# Patient Record
Sex: Female | Born: 1990 | Race: White | Hispanic: No | Marital: Married | Smoking: Current every day smoker
Health system: Southern US, Community
[De-identification: ages and names within clinical notes are randomized; demographics above are authoritative.]

## PROBLEM LIST (undated history)

## (undated) DIAGNOSIS — F329 Major depressive disorder, single episode, unspecified: Secondary | ICD-10-CM

## (undated) DIAGNOSIS — F4310 Post-traumatic stress disorder, unspecified: Secondary | ICD-10-CM

## (undated) DIAGNOSIS — J42 Unspecified chronic bronchitis: Secondary | ICD-10-CM

## (undated) DIAGNOSIS — N390 Urinary tract infection, site not specified: Secondary | ICD-10-CM

## (undated) DIAGNOSIS — F419 Anxiety disorder, unspecified: Secondary | ICD-10-CM

## (undated) DIAGNOSIS — R519 Headache, unspecified: Secondary | ICD-10-CM

## (undated) DIAGNOSIS — G43909 Migraine, unspecified, not intractable, without status migrainosus: Secondary | ICD-10-CM

## (undated) DIAGNOSIS — F3181 Bipolar II disorder: Secondary | ICD-10-CM

## (undated) DIAGNOSIS — M797 Fibromyalgia: Secondary | ICD-10-CM

## (undated) DIAGNOSIS — F32A Depression, unspecified: Secondary | ICD-10-CM

## (undated) DIAGNOSIS — B019 Varicella without complication: Secondary | ICD-10-CM

## (undated) DIAGNOSIS — I959 Hypotension, unspecified: Secondary | ICD-10-CM

## (undated) DIAGNOSIS — G4700 Insomnia, unspecified: Secondary | ICD-10-CM

## (undated) DIAGNOSIS — R32 Unspecified urinary incontinence: Secondary | ICD-10-CM

## (undated) DIAGNOSIS — J45909 Unspecified asthma, uncomplicated: Secondary | ICD-10-CM

## (undated) DIAGNOSIS — M1990 Unspecified osteoarthritis, unspecified site: Secondary | ICD-10-CM

## (undated) DIAGNOSIS — R51 Headache: Secondary | ICD-10-CM

## (undated) DIAGNOSIS — K219 Gastro-esophageal reflux disease without esophagitis: Secondary | ICD-10-CM

## (undated) DIAGNOSIS — R55 Syncope and collapse: Secondary | ICD-10-CM

## (undated) DIAGNOSIS — F509 Eating disorder, unspecified: Secondary | ICD-10-CM

## (undated) HISTORY — DX: Eating disorder, unspecified: F50.9

## (undated) HISTORY — DX: Syncope and collapse: R55

## (undated) HISTORY — DX: Headache, unspecified: R51.9

## (undated) HISTORY — DX: Urinary tract infection, site not specified: N39.0

## (undated) HISTORY — PX: TUBAL LIGATION: SHX77

## (undated) HISTORY — PX: SEPTOPLASTY: SUR1290

## (undated) HISTORY — DX: Unspecified osteoarthritis, unspecified site: M19.90

## (undated) HISTORY — DX: Unspecified urinary incontinence: R32

## (undated) HISTORY — DX: Unspecified chronic bronchitis: J42

## (undated) HISTORY — DX: Major depressive disorder, single episode, unspecified: F32.9

## (undated) HISTORY — DX: Gastro-esophageal reflux disease without esophagitis: K21.9

## (undated) HISTORY — DX: Varicella without complication: B01.9

## (undated) HISTORY — DX: Depression, unspecified: F32.A

## (undated) HISTORY — DX: Migraine, unspecified, not intractable, without status migrainosus: G43.909

## (undated) HISTORY — DX: Headache: R51

---

## 2018-08-31 ENCOUNTER — Other Ambulatory Visit: Payer: Self-pay

## 2018-08-31 ENCOUNTER — Emergency Department (HOSPITAL_COMMUNITY)
Admission: EM | Admit: 2018-08-31 | Discharge: 2018-08-31 | Disposition: A | Discharge status: Home / Self Care | Payer: 59 | Attending: Emergency Medicine | Admitting: Emergency Medicine

## 2018-08-31 ENCOUNTER — Encounter (HOSPITAL_COMMUNITY): Payer: Self-pay | Admitting: Obstetrics and Gynecology

## 2018-08-31 ENCOUNTER — Emergency Department (HOSPITAL_COMMUNITY): Payer: 59

## 2018-08-31 DIAGNOSIS — Z793 Long term (current) use of hormonal contraceptives: Secondary | ICD-10-CM | POA: Insufficient documentation

## 2018-08-31 DIAGNOSIS — R0789 Other chest pain: Secondary | ICD-10-CM | POA: Insufficient documentation

## 2018-08-31 DIAGNOSIS — F319 Bipolar disorder, unspecified: Secondary | ICD-10-CM | POA: Diagnosis not present

## 2018-08-31 DIAGNOSIS — F17210 Nicotine dependence, cigarettes, uncomplicated: Secondary | ICD-10-CM | POA: Diagnosis not present

## 2018-08-31 DIAGNOSIS — R0602 Shortness of breath: Secondary | ICD-10-CM | POA: Diagnosis not present

## 2018-08-31 DIAGNOSIS — Z5320 Procedure and treatment not carried out because of patient's decision for unspecified reasons: Secondary | ICD-10-CM | POA: Insufficient documentation

## 2018-08-31 DIAGNOSIS — R002 Palpitations: Secondary | ICD-10-CM | POA: Diagnosis not present

## 2018-08-31 HISTORY — DX: Anxiety disorder, unspecified: F41.9

## 2018-08-31 HISTORY — DX: Unspecified asthma, uncomplicated: J45.909

## 2018-08-31 HISTORY — DX: Migraine, unspecified, not intractable, without status migrainosus: G43.909

## 2018-08-31 HISTORY — DX: Insomnia, unspecified: G47.00

## 2018-08-31 HISTORY — DX: Hypotension, unspecified: I95.9

## 2018-08-31 HISTORY — DX: Post-traumatic stress disorder, unspecified: F43.10

## 2018-08-31 HISTORY — DX: Bipolar II disorder: F31.81

## 2018-08-31 LAB — BASIC METABOLIC PANEL
Anion gap: 7 (ref 5–15)
BUN: 12 mg/dL (ref 6–20)
CHLORIDE: 110 mmol/L (ref 98–111)
CO2: 24 mmol/L (ref 22–32)
Calcium: 9.3 mg/dL (ref 8.9–10.3)
Creatinine, Ser: 0.86 mg/dL (ref 0.44–1.00)
GFR calc Af Amer: >60 mL/min (ref >60)
GFR calc non Af Amer: >60 mL/min (ref >60)
GLUCOSE: 96 mg/dL (ref 70–99)
POTASSIUM: 4.3 mmol/L (ref 3.5–5.1)
SODIUM: 141 mmol/L (ref 135–145)

## 2018-08-31 LAB — CBC
HEMATOCRIT: 41.2 % (ref 36.0–46.0)
HEMOGLOBIN: 13.3 g/dL (ref 12.0–15.0)
MCH: 30 pg (ref 26.0–34.0)
MCHC: 32.3 g/dL (ref 30.0–36.0)
MCV: 93 fL (ref 80.0–100.0)
Platelets: 247 10*3/uL (ref 150–400)
RBC: 4.43 MIL/uL (ref 3.87–5.11)
RDW: 12.9 % (ref 11.5–15.5)
WBC: 6.2 10*3/uL (ref 4.0–10.5)
nRBC: 0 % (ref 0.0–0.2)

## 2018-08-31 LAB — I-STAT BETA HCG BLOOD, ED (NOT ORDERABLE): I-stat hCG, quantitative: <5 m[IU]/mL (ref <5)

## 2018-08-31 LAB — POCT I-STAT TROPONIN I: Troponin i, poc: 0 ng/mL (ref 0.00–0.08)

## 2018-08-31 LAB — TSH: TSH: 1.18 u[IU]/mL (ref 0.350–4.500)

## 2018-08-31 NOTE — ED Triage Notes (Signed)
Pt reports she has been having neck pain to her left arm, palpitations that wake her from the dead of sleep. Pt reports her left arm was completely numb when it woke her up this morning. Pt reports SOB as well.

## 2018-08-31 NOTE — ED Provider Notes (Signed)
[facility] Provider Note   CSN: [id number] Arrival date & time: 08/31/18  1245     History   Chief Complaint Chief Complaint  Patient presents with  . Palpitations  . Shortness of Breath    HPI Anne Hubbard is a 27 y.o. female.  The history is provided by the patient. No language interpreter was used.  Palpitations   Associated symptoms include shortness of breath.  Shortness of Breath      27 year old female with history of bipolar, PTSD, anxiety, asthma, presenting complaining of heart palpitation.  Patient states for the past 3 weeks she has had intermittent episodes of heart racing.  Heart sometimes racing as high as 140 bpm which was evidence on her smart watch..  Occasionally she was experienced pressure sensation to her midsternum that radiates across her shoulders and arm sometimes which may last for nearly a day, usually improves after taking Klonopin.  Today, she felt her heart racing, and taking Klonopin did not provide any significant relief.  She sometimes endorse lightheadedness dizziness.  She has chronic neck pain and chronic trouble swallowing which is not new.  She admits to drinking caffeine on a regular basis but not an increasing amount or any other stimulant medication.  Patient does vape.  She admits she recently [place] here from [place] which does increase her stress.  She denies any significant family history of premature cardiac death.  She endorsed her discomfort is mild at this time.  Patient does report that her mom has history of thyroid disease.   Past Medical History:  Diagnosis Date  . Anxiety   . Asthma   . Bipolar 2 disorder (HCC)   . Hypotension   . Insomnia   . Migraine   . PTSD (post-traumatic stress disorder)     There are no active problems to display for this patient.    The histories are not reviewed yet. Please review them in the "History" navigator section and refresh this  SmartLink.   OB History    Gravida      Para      Term      Preterm      AB      Living  0     SAB      TAB      Ectopic      Multiple      Live Births               Home Medications    Prior to Admission medications   Medication Sig Start Date End Date Taking? Authorizing Provider  clonazePAM (KLONOPIN) 1 MG tablet Take 1 mg by mouth 2 (two) times daily as needed for anxiety.   Yes [provider]  ibuprofen (ADVIL,MOTRIN) 800 MG tablet Take 800 mg by mouth every 8 (eight) hours as needed for headache.   Yes [provider]  Levonorgest-Eth Est & Eth Est Georg Ruddle(FAYOSIM) 42-21-21-7 DAYS TABS Take 1 tablet by mouth daily.   Yes [provider]  ondansetron (ZOFRAN) 4 MG tablet Take 4 mg by mouth every 8 (eight) hours as needed for nausea or vomiting.   Yes [provider]  sulfamethoxazole-trimethoprim (BACTRIM DS,SEPTRA DS) 800-160 MG tablet Take 1 tablet by mouth 2 (two) times daily.   Yes [provider]    Family History No family history on file.  Social History Social History   Tobacco Use  . Smoking status: Former Smoker    Last  attempt to quit: 08/31/2016    Years since quitting: 2.0  . Smokeless tobacco: Never Used  Substance Use Topics  . Alcohol use: Not Currently  . Drug use: Not Currently     Allergies   Benadryl [diphenhydramine]; Penicillins; Buspirone; Flax seed [bio-flax]; and Kiwi extract   Review of Systems Review of Systems  Respiratory: Positive for shortness of breath.   Cardiovascular: Positive for palpitations.  All other systems reviewed and are negative.    Physical Exam Updated Vital Signs BP 136/87 (BP Location: Left Arm)   Pulse 93   Temp 99 F (37.2 C) (Oral)   Resp (!) 22   Ht 5\' 3"  (1.6 m)   Wt 63.5 kg   LMP 01/29/2018 (Approximate)   SpO2 100%   BMI 24.80 kg/m   Physical Exam  Constitutional: She appears well-developed and well-nourished. No distress.  HENT:   Head: Atraumatic.  Eyes: Conjunctivae are normal.  Neck: Normal range of motion. Neck supple. No JVD present. No tracheal deviation present. No thyromegaly present.  Cardiovascular: Normal rate and regular rhythm.  Pulmonary/Chest: Breath sounds normal. She is in respiratory distress. She has no decreased breath sounds. She has no wheezes. She has no rhonchi. She has no rales.  Abdominal: Soft. There is no tenderness.  Musculoskeletal:       Right lower leg: She exhibits no edema.       Left lower leg: She exhibits no edema.  5 out of 5 strength to all 4 extremities.  Normal grip strength bilaterally.  Radial pulse 2+.  Sensation is intact throughout bilateral upper extremities.  Neurological: She is alert.  Skin: No rash noted.  Psychiatric: She has a normal mood and affect.  Nursing note and vitals reviewed.    ED Treatments / Results  Labs (all labs ordered are listed, but only abnormal results are displayed) Labs Reviewed  BASIC METABOLIC PANEL  CBC  TSH  I-STAT TROPONIN, ED  I-STAT BETA HCG BLOOD, ED ([facility], [facility], [facility] ONLY)  I-STAT BETA HCG BLOOD, ED (NOT ORDERABLE)  POCT I-STAT TROPONIN I  I-STAT TROPONIN, ED    EKG None   Date: 08/31/2018  Rate: 94  Rhythm: normal sinus rhythm  QRS Axis: normal  Intervals: normal  ST/T Wave abnormalities: normal  Conduction Disutrbances: none  Narrative Interpretation:   Old EKG Reviewed: No significant changes noted     Radiology Dg Chest 2 View  Result Date: 08/31/2018 CLINICAL DATA:  Acute onset LEFT-sided chest pain radiating into the LEFT arm associated with shortness of breath. EXAM: CHEST - 2 VIEW COMPARISON:  None. FINDINGS: Cardiomediastinal silhouette unremarkable. Lungs clear. Bronchovascular markings normal. Pulmonary vascularity normal. No pneumothorax. No pleural effusions. Visualized bony thorax intact. IMPRESSION: Normal examination. Electronically Signed   By: Hulan Saas M.D.   On: 08/31/2018 14:26     Procedures Procedures (including critical care time)  Medications Ordered in ED Medications - No data to display   Initial Impression / Assessment and Plan / ED Course  I have reviewed the triage vital signs and the nursing notes.  Pertinent labs & imaging results that were available during my care of the patient were reviewed by me and considered in my medical decision making (see chart for details).     BP 121/80 (BP Location: Left Arm)   Pulse 90   Temp 99 F (37.2 C) (Oral)   Resp 18   Ht 5\' 3"  (1.6 m)   Wt 63.5 kg   LMP 01/29/2018 (Approximate)  SpO2 97%   BMI 24.80 kg/m    Final Clinical Impressions(s) / ED Diagnoses   Final diagnoses:  Heart palpitations    ED Discharge Orders    None     3:24 PM Patient report having intermittent chest discomfort as well as heart palpitation for the past several weeks which has become progressively worse.  Most recent episodes awoke her from sleep and endorse tingling numbness to her left arm.  On exam, patient is resting comfortably in no acute discomfort.  No reproducible pain on exam.  She is neurovascular intact throughout all 4 extremities.  At this time I have low suspicion for dissection causing arm tingling sensation.  Her heart score is 2, low risk of mace.  She is PERC negative, doubt PE.  Given complaints of intermittent heart palpitation, will check TSH.  5:32 PM Normal TSH.  Pt request to be discharge at this time.  2nd set of troponin have not been performed yet.  Pt provided with resources, return precaution given.     Fayrene Helper, PA-C 08/31/18 1733    Mancel Bale, MD 09/01/18 [phone]

## 2018-09-10 ENCOUNTER — Emergency Department (HOSPITAL_COMMUNITY)
Admission: EM | Admit: 2018-09-10 | Discharge: 2018-09-10 | Disposition: A | Discharge status: Home / Self Care | Payer: 59 | Attending: Emergency Medicine | Admitting: Emergency Medicine

## 2018-09-10 ENCOUNTER — Other Ambulatory Visit: Payer: Self-pay

## 2018-09-10 ENCOUNTER — Encounter (HOSPITAL_COMMUNITY): Payer: Self-pay | Admitting: *Deleted

## 2018-09-10 DIAGNOSIS — M542 Cervicalgia: Secondary | ICD-10-CM | POA: Insufficient documentation

## 2018-09-10 DIAGNOSIS — Z79899 Other long term (current) drug therapy: Secondary | ICD-10-CM | POA: Diagnosis not present

## 2018-09-10 DIAGNOSIS — Z87891 Personal history of nicotine dependence: Secondary | ICD-10-CM | POA: Insufficient documentation

## 2018-09-10 DIAGNOSIS — H9202 Otalgia, left ear: Secondary | ICD-10-CM | POA: Insufficient documentation

## 2018-09-10 DIAGNOSIS — J45909 Unspecified asthma, uncomplicated: Secondary | ICD-10-CM | POA: Insufficient documentation

## 2018-09-10 HISTORY — DX: Fibromyalgia: M79.7

## 2018-09-10 MED ORDER — DIAZEPAM 5 MG PO TABS: 5.0000 mg | ORAL_TABLET | Freq: Every evening | ORAL | 0 refills | Status: DC | PRN

## 2018-09-10 NOTE — ED Provider Notes (Signed)
[facility]-EMERGENCY DEPT Provider Note: Anne Dell, MD, FACEP  CSN: [id number] MRN: [id number] ARRIVAL: 09/10/18 at 0137 ROOM: WA09/WA09   CHIEF COMPLAINT  Neck Pain and loss of hearing   HISTORY OF PRESENT ILLNESS  09/10/18 3:20 AM Anne Hubbard is a 27 y.o. female with a history of fibromyalgia, cervical degenerative disc disease and recurrent otitis media.  She is here with the sudden onset of a severe, sharp pain in her right occiput that radiated to the right side of her head and neck.  It was severe enough to prevent her from turning her neck.  It has subsequently significantly improved and is currently mild.  It is different than previous episodes of cervical radiculopathy.  She also has a history of recurrent otitis media and is having a sense of decreased hearing and pressure in her left ear.    Past Medical History:  Diagnosis Date  . Anxiety   . Asthma   . Bipolar 2 disorder (HCC)   . Fibromyalgia   . Hypotension   . Insomnia   . Migraine   . PTSD (post-traumatic stress disorder)     Past Surgical History:  Procedure Laterality Date  . SEPTOPLASTY    . TUBAL LIGATION      No family history on file.  Social History   Tobacco Use  . Smoking status: Former Smoker    Last attempt to quit: 08/31/2016    Years since quitting: 2.0  . Smokeless tobacco: Never Used  Substance Use Topics  . Alcohol use: Not Currently  . Drug use: Not Currently    Prior to Admission medications   Medication Sig Start Date End Date Taking? Authorizing Provider  clonazePAM (KLONOPIN) 1 MG tablet Take 1 mg by mouth 2 (two) times daily as needed for anxiety.    [provider]  ibuprofen (ADVIL,MOTRIN) 800 MG tablet Take 800 mg by mouth every 8 (eight) hours as needed for headache.    [provider]  Levonorgest-Eth Est & Eth Est Georg Ruddle) 42-21-21-7 DAYS TABS Take 1 tablet by mouth daily.    [provider]  ondansetron (ZOFRAN) 4 MG tablet Take 4 mg by  mouth every 8 (eight) hours as needed for nausea or vomiting.    [provider]  sulfamethoxazole-trimethoprim (BACTRIM DS,SEPTRA DS) 800-160 MG tablet Take 1 tablet by mouth 2 (two) times daily.    [provider]    Allergies Benadryl [diphenhydramine]; Penicillins; Buspirone; Flax seed [bio-flax]; and Kiwi extract   REVIEW OF SYSTEMS  Negative except as noted here or in the History of Present Illness.   PHYSICAL EXAMINATION  Initial Vital Signs Blood pressure 105/77, pulse 78, temperature 98.5 F (36.9 C), temperature source Oral, resp. rate 16, height 5\' 3"  (1.6 m), weight 63.5 kg, SpO2 100 %.  Examination General: Well-developed, well-nourished female in no acute distress; appearance consistent with age of record HENT: normocephalic; atraumatic; right TM normal; left TM slightly bulging but without erythema; mild right occipital tenderness Eyes: pupils equal, round and reactive to light; extraocular muscles intact Neck: supple Heart: regular rate and rhythm Lungs: clear to auscultation bilaterally Abdomen: soft; nondistended; nontender; bowel sounds present Extremities: No deformity; full range of motion Neurologic: Awake, alert and oriented; motor function intact in all extremities and symmetric; no facial droop Skin: Warm and dry Psychiatric: Normal mood and affect   RESULTS  Summary of this visit's results, reviewed by myself:   EKG Interpretation  Date/Time:    Ventricular Rate:  PR Interval:    QRS Duration:   QT Interval:    QTC Calculation:   R Axis:     Text Interpretation:        Laboratory Studies: No results found for this or any previous visit (from the past 24 hour(s)). Imaging Studies: No results found.  ED COURSE and MDM  Nursing notes and initial vitals signs, including pulse oximetry, reviewed.  Vitals:   09/10/18 0226 09/10/18 0229  BP: 105/77   Pulse: 78   Resp: 16   Temp: 98.5 F (36.9 C)   TempSrc: Oral     SpO2: 100%   Weight:  63.5 kg  Height:  5\' 3"  (1.6 m)   The patient declined in a simple nerve block.  She is requesting referral to ENT as her previous ENT had recommended tympanostomy tubes but she did not follow-up.  She is requesting something to help her sleep and we will try a short course of Valium.  PROCEDURES    ED DIAGNOSES     ICD-10-CM   1. Neck pain M54.2   2. Ear pain, left H92.02        Anne Hubbard, Anne RuizJohn, MD 09/10/18 [phone]

## 2018-09-10 NOTE — ED Triage Notes (Signed)
Pt stated "I started having neck pain about 20 minutes ago, losing my hearing and can't turn my head left."

## 2018-09-17 ENCOUNTER — Ambulatory Visit (INDEPENDENT_AMBULATORY_CARE_PROVIDER_SITE_OTHER): Payer: 59 | Admitting: Family Medicine

## 2018-09-17 ENCOUNTER — Encounter: Payer: Self-pay | Admitting: Family Medicine

## 2018-09-17 VITALS — BP 110/60 | HR 96 | Ht 63.0 in | Wt 138.0 lb

## 2018-09-17 DIAGNOSIS — F3130 Bipolar disorder, current episode depressed, mild or moderate severity, unspecified: Secondary | ICD-10-CM | POA: Diagnosis not present

## 2018-09-17 DIAGNOSIS — Z0000 Encounter for general adult medical examination without abnormal findings: Secondary | ICD-10-CM | POA: Diagnosis not present

## 2018-09-17 DIAGNOSIS — G43909 Migraine, unspecified, not intractable, without status migrainosus: Secondary | ICD-10-CM | POA: Diagnosis not present

## 2018-09-17 DIAGNOSIS — F4310 Post-traumatic stress disorder, unspecified: Secondary | ICD-10-CM

## 2018-09-17 DIAGNOSIS — H6982 Other specified disorders of Eustachian tube, left ear: Secondary | ICD-10-CM

## 2018-09-17 DIAGNOSIS — R002 Palpitations: Secondary | ICD-10-CM

## 2018-09-17 DIAGNOSIS — M2550 Pain in unspecified joint: Secondary | ICD-10-CM | POA: Diagnosis not present

## 2018-09-17 DIAGNOSIS — H6992 Unspecified Eustachian tube disorder, left ear: Secondary | ICD-10-CM

## 2018-09-17 LAB — URINALYSIS, ROUTINE W REFLEX MICROSCOPIC
Nitrite: NEGATIVE
Specific Gravity, Urine: 1.025 (ref 1.000–1.030)
UROBILINOGEN UA: 0.2 (ref 0.0–1.0)
Urine Glucose: NEGATIVE
pH: 6.5 (ref 5.0–8.0)

## 2018-09-17 LAB — COMPREHENSIVE METABOLIC PANEL
ALK PHOS: 51 U/L (ref 39–117)
ALT: 10 U/L (ref 0–35)
AST: 16 U/L (ref 0–37)
Albumin: 4.4 g/dL (ref 3.5–5.2)
BILIRUBIN TOTAL: 1.7 mg/dL — AB (ref 0.2–1.2)
BUN: 10 mg/dL (ref 6–23)
CALCIUM: 9.6 mg/dL (ref 8.4–10.5)
CO2: 27 meq/L (ref 19–32)
CREATININE: 0.91 mg/dL (ref 0.40–1.20)
Chloride: 107 mEq/L (ref 96–112)
GFR: 78.65 mL/min (ref >60.00)
GLUCOSE: 97 mg/dL (ref 70–99)
Potassium: 4.1 mEq/L (ref 3.5–5.1)
Sodium: 139 mEq/L (ref 135–145)
TOTAL PROTEIN: 7.6 g/dL (ref 6.0–8.3)

## 2018-09-17 LAB — LIPID PANEL
CHOL/HDL RATIO: 3
Cholesterol: 111 mg/dL (ref 0–200)
HDL: 37.8 mg/dL — ABNORMAL LOW (ref >39.00)
LDL Cholesterol: 59 mg/dL (ref 0–99)
NonHDL: 72.88
Triglycerides: 67 mg/dL (ref 0.0–149.0)
VLDL: 13.4 mg/dL (ref 0.0–40.0)
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
## 2018-09-18 LAB — HIV ANTIBODY (ROUTINE TESTING W REFLEX): HIV 1&2 Ab, 4th Generation: NONREACTIVE

## 2018-09-24 ENCOUNTER — Ambulatory Visit (INDEPENDENT_AMBULATORY_CARE_PROVIDER_SITE_OTHER): Payer: 59 | Admitting: Cardiology

## 2018-09-24 ENCOUNTER — Encounter: Payer: Self-pay | Admitting: Cardiology

## 2018-09-24 VITALS — BP 114/78 | HR 99 | Ht 63.0 in | Wt 138.1 lb

## 2018-09-24 DIAGNOSIS — I951 Orthostatic hypotension: Secondary | ICD-10-CM

## 2018-09-24 DIAGNOSIS — R002 Palpitations: Secondary | ICD-10-CM | POA: Diagnosis not present

## 2018-09-24 DIAGNOSIS — M249 Joint derangement, unspecified: Secondary | ICD-10-CM | POA: Diagnosis not present

## 2018-09-24 NOTE — Patient Instructions (Addendum)
Medication Instructions:  Your provider recommends that you continue on your current medications as directed. Please refer to the Current Medication list given to you today.    Labwork: None  Testing/Procedures: Your provider has requested that you have an echocardiogram. Echocardiography is a painless test that uses sound waves to create images of your heart. It provides your doctor with information about the size and shape of your heart and how well your heart's chambers and valves are working. This procedure takes approximately one hour. There are no restrictions for this procedure. You are scheduled for your ultrasound Friday, December 20 at 4:00PM. Please arrive by 3:30PM.   Follow-Up: You will be called to arrange an appointment with Dr. Jomarie LongsJoseph (the geneticist).   Your provider recommends that you schedule a follow-up appointment AS NEEDED with Dr. Anne FuSkains pending study results.

## 2018-09-24 NOTE — Progress Notes (Signed)
Cardiology Office Note:    Date:  09/24/2018   ID:  Anne DarterGina Hubbard, DOB 03/16/1991, MRN [id number]  PCP:  Mliss SaxKremer, William Alfred, MD  Cardiologist:  No primary care provider on file.  Electrophysiologist:  None   Referring MD: Mliss SaxKremer, William Alfred,*     History of Present Illness:    Anne DarterGina Hubbard is a 27 y.o. female here for the evaluation of palpitations at the request of Dr. Doreene BurkeKremer.  Was in the emergency department on 09/10/2018 as well as 08/31/2018.  In review of their notes, she has a history of fibromyalgia PTSD cervical degenerative disease and recurrent otitis media.  In late November she noted that she was having for the past 3 weeks intermittent racing heart.  Sometimes her heart rate was as high as 140 bpm and this was detected by her smart watch.  She experienced pressure to the mid sternum to the shoulders usually improving by Klonopin.  She went to the ER on 08/31/2018 because the Klonopin did not relieve her heart racing.  Chronic neck pain trouble swallowing she uses a vape.  EKG from 08/31/2018 shows sinus rhythm with mild sinus arrhythmia, normal variant heart rate in the 80s to 90s. Every other day. Last Thurs 2 episodes bad feel and and hear 120-130, beating hart. CP with this. Maybe SOB at times when laying. Sitting.  Pectus deformity, hypermobile joints.  Dislocations.  Has seen orthopedics.  Cut down on caffiene. Feels worse. At rest 95-100. On standing, HR will increase. BP decreases. 117 when stnding.  Worries about EDS.   Exercising at one point, her heart rate she says went up to 220 transiently.  Quit cigs 2 years ago. Vapes.   Past Medical History:  Diagnosis Date  . Anxiety   . Arthritis   . Asthma   . Bipolar 2 disorder (HCC)   . Chicken pox   . Chronic bronchitis (HCC)   . Depression   . Eating disorder   . Fainting spell   . Fibromyalgia   . Frequent headaches   . GERD (gastroesophageal reflux disease)   . Hypotension   . Insomnia   .  Migraine   . Migraines   . PTSD (post-traumatic stress disorder)   . Urinary tract infection   . Urine incontinence     Past Surgical History:  Procedure Laterality Date  . SEPTOPLASTY    . TUBAL LIGATION      Current Medications: Current Meds  Medication Sig  . clonazePAM (KLONOPIN) 1 MG tablet Take 1 mg by mouth 2 (two) times daily as needed for anxiety.  . diazepam (VALIUM) 5 MG tablet Take 1 tablet (5 mg total) by mouth at bedtime as needed (insomnia).  Marland Kitchen. ibuprofen (ADVIL,MOTRIN) 800 MG tablet Take 800 mg by mouth every 8 (eight) hours as needed for headache.  Clelia Schaumann. Levonorgest-Eth Est & Eth Est (FAYOSIM) 42-21-21-7 DAYS TABS Take 1 tablet by mouth daily.  . ondansetron (ZOFRAN) 4 MG tablet Take 4 mg by mouth every 8 (eight) hours as needed for nausea or vomiting.     Allergies:   Amoxicillin; Benadryl [diphenhydramine]; Penicillins; Buspirone; Flax seed [bio-flax]; and Kiwi extract   Social History   Socioeconomic History  . Marital status: Married    Spouse name: Not on file  . Number of children: Not on file  . Years of education: Not on file  . Highest education level: Not on file  Occupational History  . Not on file  Social Needs  . Financial  resource strain: Not on file  . Food insecurity:    Worry: Not on file    Inability: Not on file  . Transportation needs:    Medical: Not on file    Non-medical: Not on file  Tobacco Use  . Smoking status: Current Every Day Smoker    Types: E-cigarettes    Last attempt to quit: 08/31/2016    Years since quitting: 2.0  . Smokeless tobacco: Never Used  Substance and Sexual Activity  . Alcohol use: Not Currently  . Drug use: Not Currently  . Sexual activity: Not Currently  Lifestyle  . Physical activity:    Days per week: Not on file    Minutes per session: Not on file  . Stress: Not on file  Relationships  . Social connections:    Talks on phone: Not on file    Gets together: Not on file    Attends religious  service: Not on file    Active member of club or organization: Not on file    Attends meetings of clubs or organizations: Not on file    Relationship status: Not on file  Other Topics Concern  . Not on file  Social History Narrative  . Not on file     Family History: The patient's family history includes Alcohol abuse in her sister; Arthritis in her father, maternal grandmother, and mother; Asthma in her mother and sister; COPD in her maternal grandfather; Cancer in her maternal grandfather and maternal grandmother; Depression in her mother, sister, and sister; Diabetes in her mother; Drug abuse in her mother; Hearing loss in her father, maternal grandfather, and sister; Hyperlipidemia in her father; Hypertension in her father; Intellectual disability in her sister; Learning disabilities in her mother and sister; Mental illness in her sister and sister; Miscarriages / Stillbirths in her mother; Stroke in her maternal grandmother and mother.  ROS:   Please see the history of present illness.     All other systems reviewed and are negative.  EKGs/Labs/Other Studies Reviewed:    The following studies were reviewed today: Prior office notes lab work EKG  EKG:  EKG is not ordered today.  Prior personally reviewed and interpreted shows sinus rhythm with sinus arrhythmia heart rate in the 80s to 90s  Recent Labs: 08/31/2018: Hemoglobin 13.3; Platelets 247; TSH 1.180 09/17/2018: ALT 10; BUN 10; Creatinine, Ser 0.91; Potassium 4.1; Sodium 139  Recent Lipid Panel    Component Value Date/Time   CHOL 111 09/17/2018 1347   TRIG 67.0 09/17/2018 1347   HDL 37.80 (L) 09/17/2018 1347   CHOLHDL 3 09/17/2018 1347   VLDL 13.4 09/17/2018 1347   LDLCALC 59 09/17/2018 1347    Physical Exam:    VS:  BP 114/78   Pulse 99   Ht 5\' 3"  (1.6 m)   Wt 138 lb 1.9 oz (62.7 kg)   SpO2 98%   BMI 24.47 kg/m     Wt Readings from Last 3 Encounters:  09/24/18 138 lb 1.9 oz (62.7 kg)  09/17/18 138 lb (62.6  kg)  09/10/18 140 lb (63.5 kg)     GEN:  Well nourished, well developed in no acute distress HEENT: Normal NECK: No JVD; No carotid bruits LYMPHATICS: No lymphadenopathy CARDIAC: RRR, no murmurs, rubs, gallops RESPIRATORY:  Clear to auscultation without rales, wheezing or rhonchi  ABDOMEN: Soft, non-tender, non-distended MUSCULOSKELETAL:  No edema; No deformity, hypermobility of thumb joint noted SKIN: Warm and dry, tattoos noted NEUROLOGIC:  Alert and oriented x  3 PSYCHIATRIC:  Normal affect   ASSESSMENT:    1. Palpitations   2. Orthostatic hypotension   3. Hypermobility of joint    PLAN:    In order of problems listed above:  Palpitations - Felt as though heart rate was in the 140 range.  Smart watch.  We will check an echocardiogram to ensure proper structure and function of her heart.  TSH was normal in November 2019.  Electrolytes were normal.  At this time, conservative management with adequate hydration, salt liberalization, compression stockings when able.  She does own compression hose.  Gradual increase in decrease point toward sinus tachycardia.  Told her that if she was incessant extreme tachycardia that that would be a different modality/etiology.  She was also concerned about tachycardia mediated cardiomyopathy.  This is this was also discussed, it usually takes several days to weeks of incessant tachycardia i.e. atrial flutter at 150 bpm to cause this abnormality.  Nonetheless, we are checking an echocardiogram.  Possible EDS - I will refer to our geneticist, Dr. Jomarie Longs for further evaluation.  Hopefully she will be able to shed some light on this condition for her.  Echocardiogram will also be ordered to make sure that she does not have any proximal aortic disease.  Orthostatic hypotension/near syncope -Conservative measures salt liberalization fluids careful when getting up.  Okay to exercise.   Medication Adjustments/Labs and Tests Ordered: Current medicines  are reviewed at length with the patient today.  Concerns regarding medicines are outlined above.  Orders Placed This Encounter  Procedures  . ECHOCARDIOGRAM COMPLETE   No orders of the defined types were placed in this encounter.   Patient Instructions  Medication Instructions:  Your provider recommends that you continue on your current medications as directed. Please refer to the Current Medication list given to you today.    Labwork: None  Testing/Procedures: Your provider has requested that you have an echocardiogram. Echocardiography is a painless test that uses sound waves to create images of your heart. It provides your doctor with information about the size and shape of your heart and how well your heart's chambers and valves are working. This procedure takes approximately one hour. There are no restrictions for this procedure. You are scheduled for your ultrasound Friday, December 20 at 4:00PM. Please arrive by 3:30PM.   Follow-Up: You will be called to arrange an appointment with Dr. Jomarie Longs (the geneticist).   Your provider recommends that you schedule a follow-up appointment AS NEEDED with Dr. Anne Fu pending study results.     Signed, Donato Schultz, MD  09/24/2018 1:14 PM    [facility]

## 2018-09-26 NOTE — Progress Notes (Signed)
Established Patient Office Visit  Subjective:  Patient ID: Anne DarterGina Fichera, female    DOB: 08/16/1991  Age: 27 y.o. MRN: [id number]  CC:  Chief Complaint  Patient presents with  . Medication Management  . Referral    geneticist    HPI Anne Hubbard presents for medication management.  Patient presents today with her roommate for follow-up of her fibromyalgia, bipolar disorder, anxiety muscle spasms and insomnia.  She was given 5 mg of Valium by the emergency room doctor and is requesting to increase the dosage to 10 mg twice daily.  She is awaiting referrals to neurology, psychiatry and psychology.  She tells me that she has a pinched nerve in her neck as well.  Past Medical History:  Diagnosis Date  . Anxiety   . Arthritis   . Asthma   . Bipolar 2 disorder (HCC)   . Chicken pox   . Chronic bronchitis (HCC)   . Depression   . Eating disorder   . Fainting spell   . Fibromyalgia   . Frequent headaches   . GERD (gastroesophageal reflux disease)   . Hypotension   . Insomnia   . Migraine   . Migraines   . PTSD (post-traumatic stress disorder)   . Urinary tract infection   . Urine incontinence     Past Surgical History:  Procedure Laterality Date  . SEPTOPLASTY    . TUBAL LIGATION      Family History  Problem Relation Age of Onset  . Arthritis Mother   . Asthma Mother   . Depression Mother   . Diabetes Mother   . Drug abuse Mother   . Miscarriages / [place] Mother   . Stroke Mother   . Learning disabilities Mother   . Arthritis Father   . Hearing loss Father   . Hyperlipidemia Father   . Hypertension Father   . Alcohol abuse Sister   . Asthma Sister   . Depression Sister   . Mental illness Sister   . Arthritis Maternal Grandmother   . Cancer Maternal Grandmother   . Stroke Maternal Grandmother   . Cancer Maternal Grandfather   . COPD Maternal Grandfather   . Hearing loss Maternal Grandfather   . Depression Sister   . Hearing loss Sister   .  Intellectual disability Sister   . Mental illness Sister   . Learning disabilities Sister     Social History   Socioeconomic History  . Marital status: Married    Spouse name: Not on file  . Number of children: Not on file  . Years of education: Not on file  . Highest education level: Not on file  Occupational History  . Not on file  Social Needs  . Financial resource strain: Not on file  . Food insecurity:    Worry: Not on file    Inability: Not on file  . Transportation needs:    Medical: Not on file    Non-medical: Not on file  Tobacco Use  . Smoking status: Current Every Day Smoker    Types: E-cigarettes    Last attempt to quit: 08/31/2016    Years since quitting: 2.0  . Smokeless tobacco: Never Used  Substance and Sexual Activity  . Alcohol use: Not Currently  . Drug use: Not Currently  . Sexual activity: Not Currently  Lifestyle  . Physical activity:    Days per week: Not on file    Minutes per session: Not on file  . Stress: Not on  file  Relationships  . Social connections:    Talks on phone: Not on file    Gets together: Not on file    Attends religious service: Not on file    Active member of club or organization: Not on file    Attends meetings of clubs or organizations: Not on file    Relationship status: Not on file  . Intimate partner violence:    Fear of current or ex partner: Not on file    Emotionally abused: Not on file    Physically abused: Not on file    Forced sexual activity: Not on file  Other Topics Concern  . Not on file  Social History Narrative  . Not on file    Outpatient Medications Prior to Visit  Medication Sig Dispense Refill  . ibuprofen (ADVIL,MOTRIN) 800 MG tablet Take 800 mg by mouth every 8 (eight) hours as needed for headache.    Anne Hubbard Est (FAYOSIM) 42-21-21-7 DAYS TABS Take 1 tablet by mouth daily.    . ondansetron (ZOFRAN) 4 MG tablet Take 4 mg by mouth every 8 (eight) hours as needed for nausea  or vomiting.    . clonazePAM (KLONOPIN) 1 MG tablet Take 1 mg by mouth 2 (two) times daily as needed for anxiety.    . diazepam (VALIUM) 5 MG tablet Take 1 tablet (5 mg total) by mouth at bedtime as needed (insomnia). 10 tablet 0   No facility-administered medications prior to visit.     Allergies  Allergen Reactions  . Amoxicillin Anaphylaxis  . Benadryl [Diphenhydramine] Anaphylaxis  . Penicillins Anaphylaxis and Hives    Has patient had a PCN reaction causing immediate rash, facial/tongue/throat swelling, SOB or lightheadedness with hypotension: Yes Has patient had a PCN reaction causing severe rash involving mucus membranes or skin necrosis: No Has patient had a PCN reaction that required hospitalization: No Has patient had a PCN reaction occurring within the last 10 years: No If all of the above answers are "NO", then may proceed with Cephalosporin use.   Marland Kitchen Buspirone     SOB  . Flax Seed [Bio-Flax] Other (See Comments)    Tongue swelling  . Kiwi Extract Other (See Comments)    Tongue swelling    ROS Review of Systems  Constitutional: Negative.   Respiratory: Negative.   Cardiovascular: Negative.   Gastrointestinal: Negative.   Musculoskeletal: Positive for back pain, neck pain and neck stiffness.  Neurological: Positive for headaches.  Psychiatric/Behavioral: Positive for dysphoric mood. The patient is nervous/anxious.       Objective:    Physical Exam  Constitutional: She is oriented to person, place, and time. She appears well-developed and well-nourished. No distress.  HENT:  Head: Normocephalic and atraumatic.  Right Ear: External ear normal.  Left Ear: External ear normal.  Eyes: Right eye exhibits no discharge. Left eye exhibits no discharge. No scleral icterus.  Pulmonary/Chest: Effort normal.  Neurological: She is alert and oriented to person, place, and time.  Skin: She is not diaphoretic.  Psychiatric: She has a normal mood and affect. Her behavior is  normal.    BP 120/70   Pulse 85   Ht 5\' 3"  (1.6 m)   Wt 138 lb (62.6 kg)   SpO2 98%   BMI 24.45 kg/m  Wt Readings from Last 3 Encounters:  09/27/18 138 lb (62.6 kg)  09/24/18 138 lb 1.9 oz (62.7 kg)  09/17/18 138 lb (62.6 kg)   BP Readings from Last  3 Encounters:  09/27/18 120/70  09/24/18 114/78  09/17/18 110/60   Guideline developer:  UpToDate (see UpToDate for funding source) Date Released: June 2014  Health Maintenance Due  Topic Date Due  . Janet BerlinETANUS/TDAP  06/09/2010  . PAP-Cervical Cytology Screening  06/09/2012  . PAP SMEAR-Modifier  06/09/2012  . INFLUENZA VACCINE  05/09/2018    There are no preventive care reminders to display for this patient.  Lab Results  Component Value Date   TSH 1.180 08/31/2018   Lab Results  Component Value Date   WBC 6.2 08/31/2018   HGB 13.3 08/31/2018   HCT 41.2 08/31/2018   MCV 93.0 08/31/2018   PLT 247 08/31/2018   Lab Results  Component Value Date   NA 139 09/17/2018   K 4.1 09/17/2018   CO2 27 09/17/2018   GLUCOSE 97 09/17/2018   BUN 10 09/17/2018   CREATININE 0.91 09/17/2018   BILITOT 1.7 (H) 09/17/2018   ALKPHOS 51 09/17/2018   AST 16 09/17/2018   ALT 10 09/17/2018   PROT 7.6 09/17/2018   ALBUMIN 4.4 09/17/2018   CALCIUM 9.6 09/17/2018   ANIONGAP 7 08/31/2018   GFR 78.65 09/17/2018   Lab Results  Component Value Date   CHOL 111 09/17/2018   Lab Results  Component Value Date   HDL 37.80 (L) 09/17/2018   Lab Results  Component Value Date   LDLCALC 59 09/17/2018   Lab Results  Component Value Date   TRIG 67.0 09/17/2018   Lab Results  Component Value Date   CHOLHDL 3 09/17/2018   No results found for: HGBA1C    Assessment & Plan:   Problem List Items Addressed This Visit      Musculoskeletal and Integument   Muscle spasms of neck   Relevant Medications   diazepam (VALIUM) 5 MG tablet     Other   Bipolar affective disorder, current episode depressed (HCC)   Relevant Medications    diazepam (VALIUM) 5 MG tablet   Fibromyalgia - Primary   Relevant Orders   Ambulatory referral to Rheumatology   Anxiety   Relevant Medications   diazepam (VALIUM) 5 MG tablet   Insomnia   Relevant Medications   diazepam (VALIUM) 5 MG tablet      Meds ordered this encounter  Medications  . diazepam (VALIUM) 5 MG tablet    Sig: Take 1 tablet (5 mg total) by mouth every 12 (twelve) hours as needed for anxiety.    Dispense:  60 tablet    Refill:  0    Follow-up: No follow-ups on file.   Offered patient Seroquel to help with her bipolar disorder as well as insomnia.  She declined this medication because she told me that she had any eating disorder and did not want to gain weight.  I told her that I was uncomfortable increasing her Valium to 10 mg twice daily.  I also am not comfortable having her continue Klonopin and Valium.  We did agree to continue her on the Valium 5 mg twice daily.  I have gone ahead and referred her onto rheumatology for treatment of her reported fibromyalgia as well.  This patient is clearly in need of subspecialist care.  I spent 25 minutes with this patient with greater than 50% of the time spent in counseling.

## 2018-09-27 ENCOUNTER — Other Ambulatory Visit: Payer: Self-pay

## 2018-09-27 ENCOUNTER — Ambulatory Visit (HOSPITAL_COMMUNITY): Payer: 59 | Attending: Internal Medicine

## 2018-09-27 ENCOUNTER — Encounter: Payer: Self-pay | Admitting: Family Medicine

## 2018-09-27 ENCOUNTER — Ambulatory Visit (INDEPENDENT_AMBULATORY_CARE_PROVIDER_SITE_OTHER): Payer: 59 | Admitting: Family Medicine

## 2018-09-27 VITALS — BP 120/70 | HR 85 | Ht 63.0 in | Wt 138.0 lb

## 2018-09-27 DIAGNOSIS — F419 Anxiety disorder, unspecified: Secondary | ICD-10-CM | POA: Diagnosis not present

## 2018-09-27 DIAGNOSIS — M62838 Other muscle spasm: Secondary | ICD-10-CM

## 2018-09-27 DIAGNOSIS — F3130 Bipolar disorder, current episode depressed, mild or moderate severity, unspecified: Secondary | ICD-10-CM

## 2018-09-27 DIAGNOSIS — M797 Fibromyalgia: Secondary | ICD-10-CM | POA: Diagnosis not present

## 2018-09-27 DIAGNOSIS — R002 Palpitations: Secondary | ICD-10-CM | POA: Diagnosis not present

## 2018-09-27 DIAGNOSIS — G4700 Insomnia, unspecified: Secondary | ICD-10-CM | POA: Insufficient documentation

## 2018-09-27 LAB — ECHOCARDIOGRAM COMPLETE
Height: 63 in
Weight: 2208 oz

## 2018-09-27 MED ORDER — DIAZEPAM 5 MG PO TABS: 5.0000 mg | ORAL_TABLET | Freq: Two times a day (BID) | ORAL | 0 refills | Status: DC | PRN

## 2018-09-30 ENCOUNTER — Ambulatory Visit (INDEPENDENT_AMBULATORY_CARE_PROVIDER_SITE_OTHER): Payer: 59 | Admitting: Otolaryngology

## 2018-10-17 ENCOUNTER — Encounter: Payer: Self-pay | Admitting: Genetic Counselor

## 2018-11-07 ENCOUNTER — Ambulatory Visit (INDEPENDENT_AMBULATORY_CARE_PROVIDER_SITE_OTHER): Payer: Self-pay | Admitting: Otolaryngology

## 2018-11-08 ENCOUNTER — Ambulatory Visit: Payer: Self-pay | Admitting: Psychology

## 2018-11-15 ENCOUNTER — Emergency Department (HOSPITAL_COMMUNITY)
Admission: EM | Admit: 2018-11-15 | Discharge: 2018-11-15 | Disposition: A | Discharge status: Home / Self Care | Payer: 59 | Attending: Emergency Medicine | Admitting: Emergency Medicine

## 2018-11-15 ENCOUNTER — Telehealth: Payer: Self-pay | Admitting: Cardiology

## 2018-11-15 ENCOUNTER — Encounter (HOSPITAL_COMMUNITY): Payer: Self-pay

## 2018-11-15 DIAGNOSIS — J45909 Unspecified asthma, uncomplicated: Secondary | ICD-10-CM | POA: Insufficient documentation

## 2018-11-15 DIAGNOSIS — R000 Tachycardia, unspecified: Secondary | ICD-10-CM | POA: Diagnosis not present

## 2018-11-15 DIAGNOSIS — Z79899 Other long term (current) drug therapy: Secondary | ICD-10-CM | POA: Diagnosis not present

## 2018-11-15 DIAGNOSIS — F17290 Nicotine dependence, other tobacco product, uncomplicated: Secondary | ICD-10-CM | POA: Insufficient documentation

## 2018-11-15 DIAGNOSIS — R002 Palpitations: Secondary | ICD-10-CM | POA: Diagnosis present

## 2018-11-15 LAB — CBC
HCT: 45.4 % (ref 36.0–46.0)
Hemoglobin: 14.9 g/dL (ref 12.0–15.0)
MCH: 30 pg (ref 26.0–34.0)
MCHC: 32.8 g/dL (ref 30.0–36.0)
MCV: 91.5 fL (ref 80.0–100.0)
Platelets: 380 10*3/uL (ref 150–400)
RBC: 4.96 MIL/uL (ref 3.87–5.11)
RDW: 11.9 % (ref 11.5–15.5)
WBC: 10.1 10*3/uL (ref 4.0–10.5)
nRBC: 0 % (ref 0.0–0.2)

## 2018-11-15 LAB — I-STAT BETA HCG BLOOD, ED (MC, WL, AP ONLY): I-stat hCG, quantitative: <5 m[IU]/mL (ref <5)

## 2018-11-15 LAB — BASIC METABOLIC PANEL
Anion gap: 10 (ref 5–15)
BUN: 8 mg/dL (ref 6–20)
CALCIUM: 9.4 mg/dL (ref 8.9–10.3)
CO2: 21 mmol/L — ABNORMAL LOW (ref 22–32)
CREATININE: 0.94 mg/dL (ref 0.44–1.00)
Chloride: 108 mmol/L (ref 98–111)
GFR calc Af Amer: >60 mL/min (ref >60)
Glucose, Bld: 127 mg/dL — ABNORMAL HIGH (ref 70–99)
Potassium: 3.3 mmol/L — ABNORMAL LOW (ref 3.5–5.1)
Sodium: 139 mmol/L (ref 135–145)

## 2018-11-15 LAB — I-STAT TROPONIN, ED: TROPONIN I, POC: 0 ng/mL (ref 0.00–0.08)

## 2018-11-15 MED ORDER — SODIUM CHLORIDE 0.9 % IV BOLUS
1000.0000 mL | Freq: Once | INTRAVENOUS | Status: AC
  Administered 2018-11-15: 1000 mL via INTRAVENOUS

## 2018-11-15 MED ORDER — METOPROLOL TARTRATE 25 MG PO TABS: 50.0000 mg | ORAL_TABLET | Freq: Two times a day (BID) | ORAL | 0 refills | Status: DC | PRN

## 2018-11-15 MED ORDER — SODIUM CHLORIDE 0.9% FLUSH: 3.0000 mL | Freq: Once | INTRAVENOUS | Status: DC

## 2018-11-15 NOTE — ED Notes (Signed)
Patient told this RN she has been experiencing palpitations since Nov 2019. Patient reports she has seen a cardiologist and had an echo and ekg done recently. Patient states she was concerned that her panic attack was causing the tachycardia today, so she took her Clonopin 1mg , which did not alleviate her symptoms. That is when she came to the ED.

## 2018-11-15 NOTE — ED Triage Notes (Addendum)
HR-180  C/o palpitations and headache that started 45 min ago.   Denies taking any medications or energy drinks that would increase HR.    Hx. Panic Attack.   Patient able to speak in complete sentences. Friend with her.

## 2018-11-15 NOTE — Telephone Encounter (Signed)
New Message   STAT if HR is under 50 or over 120 (normal HR is 60-100 beats per minute)  1) What is your heart rate? 137  2) Do you have a log of your heart rate readings (document readings)? 120, 135, 137  3) Do you have any other symptoms? Very lightheaded

## 2018-11-15 NOTE — ED Provider Notes (Signed)
[facility]-EMERGENCY DEPT Provider Note   CSN: [id number] Arrival date & time: 11/15/18  1636     History   Chief Complaint Chief Complaint  Patient presents with  . Palpitations    HPI Anne Hubbard is a 28 y.o. female.  HPI Patient presents with tachycardia/palpitations.  Began around 45 minutes prior to arrival.  Heart rate 180.  History of same.  History of pots.  Also history of anxiety.  Sees Dr. Anne Fu from cardiology.  There was talk of starting some medication.  Denies drug use.  Denies pregnancy.  Denies feeling very anxious this time.  Does feel somewhat lightheaded with these episodes coming on. Past Medical History:  Diagnosis Date  . Anxiety   . Arthritis   . Asthma   . Bipolar 2 disorder (HCC)   . Chicken pox   . Chronic bronchitis (HCC)   . Depression   . Eating disorder   . Fainting spell   . Fibromyalgia   . Frequent headaches   . GERD (gastroesophageal reflux disease)   . Hypotension   . Insomnia   . Migraine   . Migraines   . PTSD (post-traumatic stress disorder)   . Urinary tract infection   . Urine incontinence     Patient Active Problem List   Diagnosis Date Noted  . Fibromyalgia 09/27/2018  . Anxiety 09/27/2018  . Muscle spasms of neck 09/27/2018  . Insomnia 09/27/2018  . ETD (Eustachian tube dysfunction), left 09/17/2018  . Migraine without status migrainosus, not intractable 09/17/2018  . Bipolar affective disorder, current episode depressed (HCC) 09/17/2018  . 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 PTSD (post-traumatic stress disorder) 09/17/2018  . Healthcare maintenance 09/17/2018    Past Surgical History:  Procedure Laterality Date  . SEPTOPLASTY    . TUBAL LIGATION       OB History    Gravida      Para      Term      Preterm      AB      Living  0     SAB      TAB      Ectopic      Multiple      Live Births               Home Medications    Prior to Admission medications    Medication Sig Start Date End Date Taking? Authorizing Provider  aspirin-acetaminophen-caffeine (EXCEDRIN MIGRAINE) [phone] MG tablet Take 1 tablet by mouth every 6 (six) hours as needed for headache.   Yes [provider]  clonazePAM (KLONOPIN) 1 MG tablet Take 1 mg by mouth 2 (two) times daily as needed for anxiety.   Yes [provider]  cyclobenzaprine (FLEXERIL) 5 MG tablet Take 5 mg by mouth 3 (three) times daily as needed for muscle spasms.   Yes [provider]  diazepam (VALIUM) 5 MG tablet Take 1 tablet (5 mg total) by mouth every 12 (twelve) hours as needed for anxiety. 09/27/18  Yes Mliss Sax, MD  ibuprofen (ADVIL,MOTRIN) 200 MG tablet Take 200 mg by mouth every 6 (six) hours as needed for mild pain.   Yes [provider]  Levonorgest-Eth Est & Eth Est Georg Ruddle) 42-21-21-7 DAYS TABS Take 1 tablet by mouth daily.   Yes [provider]  meclizine (ANTIVERT) 12.5 MG tablet Take 12.5 mg by mouth 3 (three) times daily as needed for dizziness.   Yes [provider]  ondansetron (  ZOFRAN) 4 MG tablet Take 4 mg by mouth every 8 (eight) hours as needed for nausea or vomiting.   Yes [provider]  metoprolol tartrate (LOPRESSOR) 25 MG tablet Take 2 tablets (50 mg total) by mouth 2 (two) times daily as needed. 11/15/18   Benjiman CorePickering, Breeze Berringer, MD    Family History Family History  Problem Relation Age of Onset  . Arthritis Mother   . Asthma Mother   . Depression Mother   . Diabetes Mother   . Drug abuse Mother   . Miscarriages / [place] Mother   . Stroke Mother   . Learning disabilities Mother   . Arthritis Father   . Hearing loss Father   . Hyperlipidemia Father   . Hypertension Father   . Alcohol abuse Sister   . Asthma Sister   . Depression Sister   . Mental illness Sister   . Arthritis Maternal Grandmother   . Cancer Maternal Grandmother   . Stroke Maternal Grandmother   . Cancer Maternal  Grandfather   . COPD Maternal Grandfather   . Hearing loss Maternal Grandfather   . Depression Sister   . Hearing loss Sister   . Intellectual disability Sister   . Mental illness Sister   . Learning disabilities Sister     Social History Social History   Tobacco Use  . Smoking status: Current Every Day Smoker    Types: E-cigarettes    Last attempt to quit: 08/31/2016    Years since quitting: 2.2  . Smokeless tobacco: Never Used  Substance Use Topics  . Alcohol use: Not Currently  . Drug use: Not Currently     Allergies   Amoxicillin; Benadryl [diphenhydramine]; Penicillins; Buspirone; Flax seed [bio-flax]; and Kiwi extract   Review of Systems Review of Systems  Constitutional: Negative for appetite change.  HENT: Negative for congestion.   Respiratory: Negative for shortness of breath.   Cardiovascular: Positive for palpitations. Negative for chest pain.  Genitourinary: Negative for flank pain.  Musculoskeletal: Negative for back pain.  Skin: Negative for rash.  Neurological: Positive for light-headedness.  Hematological: Does not bruise/bleed easily.     Physical Exam Updated Vital Signs BP 111/75 (BP Location: Right Arm)   Pulse (!) 104   Temp 98.6 F (37 C) (Oral)   Resp (!) 24   Ht 5\' 3"  (1.6 m)   Wt 63.5 kg   SpO2 99%   BMI 24.80 kg/m   Physical Exam Vitals signs and nursing note reviewed.  HENT:     Mouth/Throat:     Mouth: Mucous membranes are moist.  Cardiovascular:     Rate and Rhythm: Regular rhythm. Tachycardia present.  Pulmonary:     Effort: Pulmonary effort is normal.  Abdominal:     Tenderness: There is no abdominal tenderness.  Musculoskeletal:     Right lower leg: No edema.     Left lower leg: No edema.  Skin:    General: Skin is warm.     Capillary Refill: Capillary refill takes less than 2 seconds.  Neurological:     Mental Status: She is alert and oriented to person, place, and time.  Psychiatric:        Mood and Affect:  Mood normal.      ED Treatments / Results  Labs (all labs ordered are listed, but only abnormal results are displayed) Labs Reviewed  BASIC METABOLIC PANEL - Abnormal; Notable for the following components:      Result Value   Potassium 3.3 (*)  CO2 21 (*)    Glucose, Bld 127 (*)    All other components within normal limits  CBC  I-STAT TROPONIN, ED  I-STAT BETA HCG BLOOD, ED ([facility], [facility], [facility] ONLY)    EKG None  Radiology No results found.  Procedures Procedures (including critical care time)  Medications Ordered in ED Medications  sodium chloride 0.9 % bolus 1,000 mL (0 mLs Intravenous Stopped 11/15/18 1810)     Initial Impression / Assessment and Plan / ED Course  I have reviewed the triage vital signs and the nursing notes.  Pertinent labs & imaging results that were available during my care of the patient were reviewed by me and considered in my medical decision making (see chart for details).     Patient with tachycardia.  Heart rate had been up to around 180 but appeared sinus versus SVT.  Resolved on its own.  Reviewed records.  Lab work reassuring.  Will give 25 mg metoprolol as needed.  Follow-up with Dr. Anne Fu.  Final Clinical Impressions(s) / ED Diagnoses   Final diagnoses:  Tachycardia    ED Discharge Orders         Ordered    metoprolol tartrate (LOPRESSOR) 25 MG tablet  2 times daily PRN     11/15/18 1841           Benjiman Core, MD 11/16/18 0003

## 2018-11-15 NOTE — ED Notes (Signed)
Discharge instructions reviewed with patient. Patient verbalizes understanding. VSS. Patient admitted to this RN during discharge that she has been struggling with anorexia for 14 years. Patient reports she has purposefully lost a lot of weight and her idea of a large meal is a single "piece of pizza" and a diet soda. Patient educated by this RN on the need for protein and vitamins in her diet to support her health and healing. Patient also educated on hydration and how caffeine and dehydration are not helping her tachycardia. Patient encouraged to follow up with her previously scheduled cardiology appointment on Tuesday for further evaluation of her arrhythmia. Patient verbalizes she will follow up. Patient verbalizes understanding of diet and hydration education and states "I know I need to make a change." Patient states she has been trying to find a psychiatrist or counselor. Patient provided with phone number on paperwork to find a specialty provider. Patient grateful and ambulated from ED without assistance.

## 2018-11-15 NOTE — Telephone Encounter (Signed)
Patient has been having elevated HR and palpitations since early December and stated it just keeps getting worse. Patient stated she was recently diagnosis with EDS and POTS. Asked patient about her BP. Patient stated she does not have a way to check her BP at home. Patient stated the last couple of days her HR has been 120-130's and it is not going down. Patient complaining of having lightheadedness and calmly hands. Encouraged patient to go to ED if her heart rate is not coming down and with her symptoms. Made patient an appointment with Dr. Anne Fu to be evaluated. Patient state she would go to ED to get checked out and she will see Dr. Anne Fu early next week.

## 2018-11-18 NOTE — Telephone Encounter (Signed)
Thanks for update. HR 186 noted on ECG.  Donato Schultz, MD

## 2018-11-18 NOTE — Telephone Encounter (Signed)
Pt was seen, evaluated and treated in ED.  Has appt for f/u 11/19/2018 with Dr Anne Fu.

## 2018-11-19 ENCOUNTER — Encounter: Payer: Self-pay | Admitting: Cardiology

## 2018-11-19 ENCOUNTER — Ambulatory Visit (INDEPENDENT_AMBULATORY_CARE_PROVIDER_SITE_OTHER): Payer: 59 | Admitting: Cardiology

## 2018-11-19 VITALS — BP 110/76 | HR 92 | Ht 63.0 in | Wt 138.6 lb

## 2018-11-19 DIAGNOSIS — R002 Palpitations: Secondary | ICD-10-CM

## 2018-11-19 DIAGNOSIS — R000 Tachycardia, unspecified: Secondary | ICD-10-CM | POA: Diagnosis not present

## 2018-11-19 DIAGNOSIS — I951 Orthostatic hypotension: Secondary | ICD-10-CM

## 2018-11-19 DIAGNOSIS — G901 Familial dysautonomia [Riley-Day]: Secondary | ICD-10-CM

## 2018-11-19 DIAGNOSIS — M249 Joint derangement, unspecified: Secondary | ICD-10-CM

## 2018-11-19 MED ORDER — METOPROLOL TARTRATE 25 MG PO TABS: 25.0000 mg | ORAL_TABLET | Freq: Four times a day (QID) | ORAL | 6 refills | Status: AC | PRN

## 2018-11-19 NOTE — Patient Instructions (Signed)
Medication Instructions:  You may take Metoprolol tartrate 25 mg once every 6 hours as needed. Continue all other medications as listed.  If you need a refill on your cardiac medications before your next appointment, please call your pharmacy.   Please stay well hydrated with water, Gatorade etc and add salt as needed. This will help keep your blood pressure and heart rate stable.  Follow-Up: At [facility], you and your health needs are our priority.  As part of our continuing mission to provide you with exceptional heart care, we have created designated Provider Care Teams.  These Care Teams include your primary Cardiologist (physician) and Advanced Practice Providers (APPs -  Physician Assistants and Nurse Practitioners) who all work together to provide you with the care you need, when you need it. You will need a follow up appointment in 12 months.  Please call our office 2 months in advance to schedule this appointment.  You may see Donato Schultz, MD or one of the following Advanced Practice Providers on your designated Care Team:   Norma Fredrickson, NP Nada Boozer, NP . Georgie Chard, NP  Thank you for choosing [facility]!!

## 2018-11-19 NOTE — Progress Notes (Signed)
Cardiology Office Note:    Date:  11/19/2018   ID:  Bartolo Darter, DOB [birth year], MRN [id number]  PCP:  Patient, No Pcp Per  Cardiologist:  Donato Schultz, MD  Electrophysiologist:  None   Referring MD: Mliss Sax,*     History of Present Illness:    Anne Hubbard is a 28 y.o. female here for post ER follow-up, tachycardia.  Review of pulse was as high as 188, sinus tachycardia which then reduced down to 153 and 143.  Worried about hypermobility, EDS.  Wanted to talk with our geneticist.  Has a history of fibromyalgia PTSD.  Has had intermittent racing heart.  Klonopin seems to improve.  Heart rates can be as high as 140 on smart watch.  Has pectus deformity hypermobile joints.  Dislocations.  Has tried things such as cutting back on caffeine.  Still vapes.   Past Medical History:  Diagnosis Date  . Anxiety   . Arthritis   . Asthma   . Bipolar 2 disorder (HCC)   . Chicken pox   . Chronic bronchitis (HCC)   . Depression   . Eating disorder   . Fainting spell   . Fibromyalgia   . Frequent headaches   . GERD (gastroesophageal reflux disease)   . Hypotension   . Insomnia   . Migraine   . Migraines   . PTSD (post-traumatic stress disorder)   . Urinary tract infection   . Urine incontinence     Past Surgical History:  Procedure Laterality Date  . SEPTOPLASTY    . TUBAL LIGATION      Current Medications: Current Meds  Medication Sig  . aspirin-acetaminophen-caffeine (EXCEDRIN MIGRAINE) 250-250-65 MG tablet Take 1 tablet by mouth every 6 (six) hours as needed for headache.  . clonazePAM (KLONOPIN) 1 MG tablet Take 1 mg by mouth 2 (two) times daily as needed for anxiety.  . cyclobenzaprine (FLEXERIL) 5 MG tablet Take 5 mg by mouth 3 (three) times daily as needed for muscle spasms.  . diazepam (VALIUM) 5 MG tablet Take 1 tablet (5 mg total) by mouth every 12 (twelve) hours as needed for anxiety.  Marland Kitchen ibuprofen (ADVIL,MOTRIN) 200 MG tablet Take 200 mg by mouth  every 6 (six) hours as needed for mild pain.  Clelia Schaumann Est & Eth Est (FAYOSIM) 42-21-21-7 DAYS TABS Take 1 tablet by mouth daily.  . meclizine (ANTIVERT) 12.5 MG tablet Take 12.5 mg by mouth 3 (three) times daily as needed for dizziness.  . metoprolol tartrate (LOPRESSOR) 25 MG tablet Take 1 tablet (25 mg total) by mouth every 6 (six) hours as needed.  . ondansetron (ZOFRAN) 4 MG tablet Take 4 mg by mouth every 8 (eight) hours as needed for nausea or vomiting.  . [DISCONTINUED] metoprolol tartrate (LOPRESSOR) 25 MG tablet Take 2 tablets (50 mg total) by mouth 2 (two) times daily as needed.     Allergies:   Amoxicillin; Benadryl [diphenhydramine]; Penicillins; Buspirone; Flax seed [bio-flax]; and Kiwi extract   Social History   Socioeconomic History  . Marital status: Married    Spouse name: Not on file  . Number of children: Not on file  . Years of education: Not on file  . Highest education level: Not on file  Occupational History  . Not on file  Social Needs  . Financial resource strain: Not on file  . Food insecurity:    Worry: Not on file    Inability: Not on file  . Transportation needs:  Medical: Not on file    Non-medical: Not on file  Tobacco Use  . Smoking status: Current Every Day Smoker    Types: E-cigarettes    Last attempt to quit: 08/31/2016    Years since quitting: 2.2  . Smokeless tobacco: Never Used  Substance and Sexual Activity  . Alcohol use: Not Currently  . Drug use: Not Currently  . Sexual activity: Not Currently  Lifestyle  . Physical activity:    Days per week: Not on file    Minutes per session: Not on file  . Stress: Not on file  Relationships  . Social connections:    Talks on phone: Not on file    Gets together: Not on file    Attends religious service: Not on file    Active member of club or organization: Not on file    Attends meetings of clubs or organizations: Not on file    Relationship status: Not on file  Other Topics  Concern  . Not on file  Social History Narrative  . Not on file     Family History: The patient's family history includes Alcohol abuse in her sister; Arthritis in her father, maternal grandmother, and mother; Asthma in her mother and sister; COPD in her maternal grandfather; Cancer in her maternal grandfather and maternal grandmother; Depression in her mother, sister, and sister; Diabetes in her mother; Drug abuse in her mother; Hearing loss in her father, maternal grandfather, and sister; Hyperlipidemia in her father; Hypertension in her father; Intellectual disability in her sister; Learning disabilities in her mother and sister; Mental illness in her sister and sister; Miscarriages / Stillbirths in her mother; Stroke in her maternal grandmother and mother.  ROS:   Please see the history of present illness.     All other systems reviewed and are negative.  EKGs/Labs/Other Studies Reviewed:    The following studies were reviewed today: Prior office notes EKGs echocardiogram  Echocardiogram 09/27/2018 - Normal study.  EKG:  EKG is  ordered today.  The ekg ordered today demonstrates sinus rhythm 92 no other abnormalities  Recent Labs: 08/31/2018: TSH 1.180 09/17/2018: ALT 10 11/15/2018: BUN 8; Creatinine, Ser 0.94; Hemoglobin 14.9; Platelets 380; Potassium 3.3; Sodium 139  Recent Lipid Panel    Component Value Date/Time   CHOL 111 09/17/2018 1347   TRIG 67.0 09/17/2018 1347   HDL 37.80 (L) 09/17/2018 1347   CHOLHDL 3 09/17/2018 1347   VLDL 13.4 09/17/2018 1347   LDLCALC 59 09/17/2018 1347    Physical Exam:    VS:  BP 110/76   Pulse 92   Ht 5\' 3"  (1.6 m)   Wt 138 lb 9.6 oz (62.9 kg)   BMI 24.55 kg/m     Wt Readings from Last 3 Encounters:  11/19/18 138 lb 9.6 oz (62.9 kg)  11/15/18 140 lb (63.5 kg)  09/27/18 138 lb (62.6 kg)     GEN:  Well nourished, well developed in no acute distress HEENT: Normal NECK: No JVD; No carotid bruits LYMPHATICS: No  lymphadenopathy CARDIAC: RRR, no murmurs, rubs, gallops RESPIRATORY:  Clear to auscultation without rales, wheezing or rhonchi  ABDOMEN: Soft, non-tender, non-distended MUSCULOSKELETAL:  No edema; No deformity  SKIN: Warm and dry NEUROLOGIC:  Alert and oriented x 3 PSYCHIATRIC:  Normal affect   ASSESSMENT:    1. Dysautonomia (HCC)   2. Palpitations   3. Tachycardia   4. Orthostatic hypotension   5. Hypermobility of joint    PLAN:    In  order of problems listed above:  Sinus tachycardia/dysautonomia -Reassuring echocardiogram, normal function.  Reassuring blood work.  Hypermobility syndromes such as EDS can go along with dysautonomia which can result in significant tachycardia despite lack of action. -Once again conservative measures such as salt liberalization as well as increasing exercise can be very helpful.  - metoprolol has been helpful, sometimes she takes a 25 mg tablet and this makes her tachycardia improved.  She would like to continue to have this available if needed.  I warned her about getting a blood pressure cuff as this can lead to some obsessive behavior.   Medication Adjustments/Labs and Tests Ordered: Current medicines are reviewed at length with the patient today.  Concerns regarding medicines are outlined above.  Orders Placed This Encounter  Procedures  . EKG 12-Lead   Meds ordered this encounter  Medications  . metoprolol tartrate (LOPRESSOR) 25 MG tablet    Sig: Take 1 tablet (25 mg total) by mouth every 6 (six) hours as needed.    Dispense:  60 tablet    Refill:  6    Patient Instructions  Medication Instructions:  You may take Metoprolol tartrate 25 mg once every 6 hours as needed. Continue all other medications as listed.  If you need a refill on your cardiac medications before your next appointment, please call your pharmacy.   Please stay well hydrated with water, Gatorade etc and add salt as needed. This will help keep your blood pressure  and heart rate stable.  Follow-Up: At [facility], you and your health needs are our priority.  As part of our continuing mission to provide you with exceptional heart care, we have created designated Provider Care Teams.  These Care Teams include your primary Cardiologist (physician) and Advanced Practice Providers (APPs -  Physician Assistants and Nurse Practitioners) who all work together to provide you with the care you need, when you need it. You will need a follow up appointment in 12 months.  Please call our office 2 months in advance to schedule this appointment.  You may see Donato Schultz, MD or one of the following Advanced Practice Providers on your designated Care Team:   Norma Fredrickson, NP Nada Boozer, NP . Georgie Chard, NP  Thank you for choosing [facility]!!         Signed, Donato Schultz, MD  11/19/2018 5:49 PM    [facility]

## 2018-11-27 ENCOUNTER — Encounter: Payer: Self-pay | Admitting: Neurology

## 2018-11-27 ENCOUNTER — Ambulatory Visit (INDEPENDENT_AMBULATORY_CARE_PROVIDER_SITE_OTHER): Payer: 59 | Admitting: Neurology

## 2018-11-27 ENCOUNTER — Telehealth: Payer: Self-pay | Admitting: Neurology

## 2018-11-27 VITALS — BP 112/82 | HR 99 | Ht 63.0 in | Wt 141.0 lb

## 2018-11-27 DIAGNOSIS — Z8669 Personal history of other diseases of the nervous system and sense organs: Secondary | ICD-10-CM | POA: Diagnosis not present

## 2018-11-27 DIAGNOSIS — R51 Headache: Secondary | ICD-10-CM

## 2018-11-27 DIAGNOSIS — R519 Headache, unspecified: Secondary | ICD-10-CM

## 2018-11-27 NOTE — Telephone Encounter (Signed)
Patient left the room before paper work / after visit summary was explained. Called pt. To schedule 3 mo f/u and LVM due to pt not answering. Mailed pt. Paper work to her.

## 2018-11-27 NOTE — Progress Notes (Addendum)
Subjective:    Patient ID: Anne Hubbard is a 28 y.o. female.  HPI     Huston Foley, MD, PhD [facility] [address] [address] [place], [place] [address]  Dear Dr. Doreene Burke,   I saw your patient, Anne Hubbard, upon your kind request, in my neurologic clinic today for initial consultation of her recurrent headaches. The patient is unaccompanied today. As you know, Anne Hubbard is a 28 year old right-handed woman with an underlying medical history of mood disorder (including bipolar disorder and history of PTSD by chart review), arthritis, eating disorder, fibromyalgia, palpitations, tachycardia and orthostatic hypotension for which she has seen cardiology, who reports recurrent headaches for as long as she can remember. She started having migraines at least in her preteen years as I understand. She reports mostly left-sided headaches, headache character can be throbbing, aching, stabbing, associated typically with nausea but no vomiting, some photophobia. Of note, she is on metoprolol currently, started it a few weeks ago. It is for as needed use, 25 mg up to 4 times a day, per cardiology. She reports that she had a sleep study may last year, results are unavailable. She also reports having had a brain MRI in 2017, results are not available for my review today. She previously followed with a neurologist. She has tried multiple medications for headache prevention and for headache acute treatment. She does not recall the names of the medicines tried. She reports a family history of migraines in her mother. She reports that she has migrainous headaches about 4 times of the week, they can last up to 24 hours. It helps to go to bed and try to sleep. She has been taking Excedrin as needed, not daily. She takes one pill as needed, up to 3 a day if needed. She does not smoke cigarettes but uses nicotine vapor. She drinks alcohol rarely, a few times a year, has been  avoiding caffeine because of tachycardia. She is willing to sign a release of information form so we can get records from her previous neurologist, as well as her brain MRI report. She has also been referred to psychiatry as well as psychology. Appointments are pending. She reports that no one has called her yet. She has also been referred to rheumatology. She reports that she had an eye examination in or around October 2019. She reports difficulty with her memory and concentration.  Her Past Medical History Is Significant For: Past Medical History:  Diagnosis Date  . Anxiety   . Arthritis   . Asthma   . Bipolar 2 disorder (HCC)   . Chicken pox   . Chronic bronchitis (HCC)   . Depression   . Eating disorder   . Fainting spell   . Fibromyalgia   . Frequent headaches   . GERD (gastroesophageal reflux disease)   . Hypotension   . Insomnia   . Migraine   . Migraines   . PTSD (post-traumatic stress disorder)   . Urinary tract infection   . Urine incontinence     Her Past Surgical History Is Significant For: Past Surgical History:  Procedure Laterality Date  . SEPTOPLASTY    . TUBAL LIGATION      Her Family History Is Significant For: Family History  Problem Relation Age of Onset  . Arthritis Mother   . Asthma Mother   . Depression Mother   . Diabetes Mother   . Drug abuse Mother   . Miscarriages / [place] Mother   .  Stroke Mother   . Learning disabilities Mother   . Arthritis Father   . Hearing loss Father   . Hyperlipidemia Father   . Hypertension Father   . Alcohol abuse Sister   . Asthma Sister   . Depression Sister   . Mental illness Sister   . Arthritis Maternal Grandmother   . Cancer Maternal Grandmother   . Stroke Maternal Grandmother   . Cancer Maternal Grandfather   . COPD Maternal Grandfather   . Hearing loss Maternal Grandfather   . Depression Sister   . Hearing loss Sister   . Intellectual disability Sister   . Mental illness Sister   .  Learning disabilities Sister     Her Social History Is Significant For: Social History   Socioeconomic History  . Marital status: Married    Spouse name: Not on file  . Number of children: Not on file  . Years of education: Not on file  . Highest education level: Not on file  Occupational History  . Not on file  Social Needs  . Financial resource strain: Not on file  . Food insecurity:    Worry: Not on file    Inability: Not on file  . Transportation needs:    Medical: Not on file    Non-medical: Not on file  Tobacco Use  . Smoking status: Current Every Day Smoker    Types: E-cigarettes    Last attempt to quit: 08/31/2016    Years since quitting: 2.2  . Smokeless tobacco: Never Used  Substance and Sexual Activity  . Alcohol use: Not Currently  . Drug use: Not Currently  . Sexual activity: Not Currently  Lifestyle  . Physical activity:    Days per week: Not on file    Minutes per session: Not on file  . Stress: Not on file  Relationships  . Social connections:    Talks on phone: Not on file    Gets together: Not on file    Attends religious service: Not on file    Active member of club or organization: Not on file    Attends meetings of clubs or organizations: Not on file    Relationship status: Not on file  Other Topics Concern  . Not on file  Social History Narrative  . Not on file    Her Allergies Are:  Allergies  Allergen Reactions  . Amoxicillin Anaphylaxis  . Benadryl [Diphenhydramine] Anaphylaxis  . Penicillins Anaphylaxis and Hives    Has patient had a PCN reaction causing immediate rash, facial/tongue/throat swelling, SOB or lightheadedness with hypotension: Yes Has patient had a PCN reaction causing severe rash involving mucus membranes or skin necrosis: No Has patient had a PCN reaction that required hospitalization: No Has patient had a PCN reaction occurring within the last 10 years: No If all of the above answers are "NO", then may proceed  with Cephalosporin use.   Marland Kitchen. Buspirone     SOB  . Flax Seed [Bio-Flax] Other (See Comments)    Tongue swelling  . Kiwi Extract Other (See Comments)    Tongue swelling  :   Her Current Medications Are:  Outpatient Encounter Medications as of 11/27/2018  Medication Sig  . aspirin-acetaminophen-caffeine (EXCEDRIN MIGRAINE) 250-250-65 MG tablet Take 1 tablet by mouth every 6 (six) hours as needed for headache.  . clonazePAM (KLONOPIN) 1 MG tablet Take 1 mg by mouth 2 (two) times daily as needed for anxiety.  . cyclobenzaprine (FLEXERIL) 5 MG tablet Take 5  mg by mouth 3 (three) times daily as needed for muscle spasms.  Marland Kitchen ibuprofen (ADVIL,MOTRIN) 200 MG tablet Take 200 mg by mouth every 6 (six) hours as needed for mild pain.  Clelia Schaumann Est & Eth Est (FAYOSIM) 42-21-21-7 DAYS TABS Take 1 tablet by mouth daily.  . meclizine (ANTIVERT) 12.5 MG tablet Take 12.5 mg by mouth 3 (three) times daily as needed for dizziness.  . metoprolol tartrate (LOPRESSOR) 25 MG tablet Take 1 tablet (25 mg total) by mouth every 6 (six) hours as needed.  . ondansetron (ZOFRAN) 4 MG tablet Take 4 mg by mouth every 8 (eight) hours as needed for nausea or vomiting.  . [DISCONTINUED] diazepam (VALIUM) 5 MG tablet Take 1 tablet (5 mg total) by mouth every 12 (twelve) hours as needed for anxiety.   No facility-administered encounter medications on file as of 11/27/2018.   :   Review of Systems:  Out of a complete 14 point review of systems, all are reviewed and negative with the exception of these symptoms as listed below:  Review of Systems  Neurological:       Pt presents today to discuss her migraines, insomnia, and memory loss. Pt reports that she has migraines 4-5 times per week and each headache lasts for more than 24 hours. Pt has associated nausea, light sensitivity, and sound sensitivity. Pt reports that she has insomnia but had a sleep study last year but never received the results. Pt does endorse  snoring. Pt reports that she has memory loss and her MRI in 2017 showed her brain was "shrinking".  Epworth Sleepiness Scale 0= would never doze 1= slight chance of dozing 2= moderate chance of dozing 3= high chance of dozing  Sitting and reading: 1 Watching TV: 2 Sitting inactive in a public place (ex. Theater or meeting): 0 As a passenger in a car for an hour without a break: 0 Lying down to rest in the afternoon: 1 Sitting and talking to someone: 3 Sitting quietly after lunch (no alcohol): 2 In a car, while stopped in traffic: 0 Total: 9     Objective:  Neurological Exam  Physical Exam Physical Examination:   Vitals:   11/27/18 1008  BP: 112/82  Pulse: 99   General Examination: The patient is a very pleasant 28 y.o. female in no acute distress. She appears well-developed and well-nourished and adequately groomed.   HEENT: Normocephalic, atraumatic, pupils are equal, round and reactive to light and accommodation. Funduscopic exam is normal with sharp disc margins noted. She wears corrective eyeglasses. Extraocular tracking is good without limitation to gaze excursion or nystagmus noted. Normal smooth pursuit is noted. Hearing is grossly intact. Face is symmetric with normal facial animation, speech is clear, in particular, no dysarthria or voice tremor. Neck is supple, airway examination benign except for mild to moderate mouth dryness noted. Tongue protrudes centrally and palate elevates symmetrically.  Chest: Clear to auscultation without wheezing, rhonchi or crackles noted.  Heart: S1+S2+0, regular and normal without murmurs, rubs or gallops noted.   Abdomen: Soft, non-tender and non-distended with normal bowel sounds appreciated on auscultation.  Extremities: There is no pitting edema in the distal lower extremities bilaterally. Pedal pulses are intact.  Skin: Warm and dry without trophic changes noted.  Musculoskeletal: exam reveals no obvious joint deformities,  tenderness or joint swelling or erythema.   Neurologically:  Mental status: The patient is awake, alert and oriented in all 4 spheres. Her immediate and remote memory, attention, language skills and  fund of knowledge are appropriate. There is no evidence of aphasia, agnosia, apraxia or anomia. Speech is clear with normal prosody and enunciation. Thought process is linear. Mood is normal and affect is blunted.  Cranial nerves II - XII are as described above under HEENT exam. In addition: shoulder shrug is normal with equal shoulder height noted. Motor exam: Normal bulk, strength and tone is noted. There is no drift, tremor or rebound. Romberg is negative. Reflexes are 2+ throughout. Fine motor skills and coordination: intact with normal finger taps, normal hand movements, normal rapid alternating patting, normal foot taps and normal foot agility.  Cerebellar testing: No dysmetria or intention tremor on finger to nose testing. Heel to shin is unremarkable bilaterally. There is no truncal or gait ataxia.  Sensory exam: intact to light touch.  Gait, station and balance: She stands easily. No veering to one side is noted. No leaning to one side is noted. Posture is age-appropriate and stance is narrow based. Gait shows normal stride length and normal pace. No problems turning are noted. Tandem walk is unremarkable.   Assessment and Plan:   In summary, Anne Hubbard is a very pleasant 28 y.o.-year old female  with an underlying medical history of mood disorder (including bipolar disorder and history of PTSD by chart review), arthritis, eating disorder, fibromyalgia, palpitations, tachycardia and orthostatic hypotension for which she has seen cardiology, who presents for evaluation of her recurrent headaches, including migraines. Unfortunately, prior records are not available and she reports having tried multiple preventative and abortive medications. She is willing to sign a release of information form and  started filling out the form but did not actually sign it. She is advised that she has a nonfocal neurological exam thankfully. She had a previous brain MRI in or around 2017 per her report. She is advised that I would like to review the MRI report and if needed we can order another brain MRI. She is encouraged to follow through with her psychiatry and psychology referrals especially since psychology can help evaluate her concentration and memory issues as well. She is encouraged to stay better hydrated with water as she indicates that she averages one bottle of water per day typically. She also indicates that she was told previously that she was dehydrated. She is advised that dehydration can cause exacerbation and headaches. She is encouraged to rest enough and try to achieve 7 to 8 hours of sleep. Unfortunately, the patient did not sign a release of information form and left the office before she was given her AVS. She did not make a follow-up appointment before leaving. She was advised to follow-up routinely in 3 months. She was furthermore advised to avoid taking Excedrin daily as it can exacerbate headaches due to overuse. She was also advised that taking a beta blocker can be a migraine preventative and it can help her long-term that way to take the metoprolol on a more regular basis.  I was able to answer all her questions, but the patient did not stay to receive her AVS and did not make a follow-up appointment and did not sign a release of information form so we can get her previous neurology records and MRI report unfortunately. Thank you very much for allowing me to participate in the care of this nice patient. If I can be of any further assistance to you please do not hesitate to call me at [phone].  Sincerely,   Huston FoleySaima Elie Leppo, MD, PhD

## 2018-11-27 NOTE — Patient Instructions (Addendum)
Your neurological exam is non-focal, thankfully.  For your migraines you have tried several preventative medications as well as acute medications. Before we resort to trying something, I would like to review your previous records. You have recently started taking metoprolol which can also be a migraine preventative and may help you in that way. You have had a brain MRI about 2 years ago. I would like to review the report that we may reorder another MRI brain. For now, I would like for you to use Excedrin Migraine as needed. Avoid taking it daily as it can increase headaches if taken daily. Avoid caffeine. Try to get enough sleep, 7-8 hours of recommended and try to hydrate better with water as you are currently not hydrating well, averaging one bottle of water per day. I will see you in 3 months. Hopefully, we will receive records in the interim.

## 2018-11-27 NOTE — Progress Notes (Signed)
Dr. Frances Furbish asked pt to fill out a release of information form to request records from her past neurologists/primary care to find out what medications pt has tried for her migraines and her past imaging studies. Pt did not fill this form out, and checked out of the office without scheduling a follow up without waiting for her AVS to be given to her.

## 2018-12-05 ENCOUNTER — Other Ambulatory Visit: Payer: Self-pay | Admitting: *Deleted

## 2018-12-05 DIAGNOSIS — G901 Familial dysautonomia [Riley-Day]: Secondary | ICD-10-CM

## 2018-12-05 DIAGNOSIS — R002 Palpitations: Secondary | ICD-10-CM

## 2018-12-05 DIAGNOSIS — R000 Tachycardia, unspecified: Secondary | ICD-10-CM

## 2018-12-05 NOTE — Telephone Encounter (Signed)
Lets order a long-term monitor, ZIO type patch for palpitations.  Thanks Donato Schultz, MD

## 2018-12-17 ENCOUNTER — Ambulatory Visit: Payer: 59 | Admitting: Family Medicine

## 2018-12-31 ENCOUNTER — Other Ambulatory Visit: Payer: Self-pay

## 2018-12-31 ENCOUNTER — Ambulatory Visit (INDEPENDENT_AMBULATORY_CARE_PROVIDER_SITE_OTHER): Payer: 59

## 2018-12-31 DIAGNOSIS — R002 Palpitations: Secondary | ICD-10-CM | POA: Diagnosis not present

## 2018-12-31 DIAGNOSIS — R000 Tachycardia, unspecified: Secondary | ICD-10-CM

## 2018-12-31 DIAGNOSIS — G901 Familial dysautonomia [Riley-Day]: Secondary | ICD-10-CM

## 2019-01-18 ENCOUNTER — Telehealth: Payer: Self-pay | Admitting: Cardiology

## 2019-01-18 NOTE — Telephone Encounter (Signed)
Anne Hubbard is a 28 year old woman with bipolar, fibromyalgia, PTSD, tachycardia, pectus deformity who calls regarding chest pain. Patient was seen by Dr. Anne Fu in February as ED follow-up for tachycardia. 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 Was instructed at that time to continue conservative measures such as salt liberalization. Takes metoprolol as needed and also klonopin. Patient reports chest pain for the past month. Located in the center of her chest, is positional with worsened pain with lying flat or sitting up, improved with lying on side. Not related to exertion. Similar pain today but now encompassing larger area of her chest. Does have popping of costosternal joints 2/2 pectus deformity at baseline but reports that this has not improved her pain as it normally does. Several weeks ago had shortness of breath but this has resolved. Today was light headed during the day with standing. Mildly worse than baseline but no pre-syncope. This evening, HR elevated to 120s (BL 90s-110s), took 25mg  metoprolol at 8pm and then 1mg  klonopin at apprx 11pm. Currently BP 119/79, Tmax 98.8. Continues to have chest pain as above. Discussed multiple possible etiologies of pain including costochondritis and reflux, both of which she has a history of. Discussed low risk for MI given age, gender, duration of symptoms (1 month). She does have mildly elevated HR, although a known issue and otherwise is hemodynamically stable. Advised that she could take ibuprofen tonight in the setting of possible costochondritis. She will call or seek medical attention if symptoms worsen. Will arrange for follow-up via phone or in office with Dr. Anne Fu. Patient noted understanding and agreed with the plan.   Starlyn Skeans, MD

## 2019-01-20 ENCOUNTER — Telehealth: Payer: Self-pay | Admitting: General Practice

## 2019-01-20 NOTE — Telephone Encounter (Signed)
Tried to call and see if patient would like to reschedule last cancel with Doreene Burke for a virtual visit, left message

## 2019-02-15 ENCOUNTER — Telehealth: Payer: Self-pay | Admitting: Cardiology

## 2019-02-15 NOTE — Telephone Encounter (Signed)
Pt was just prescribed trazadone for sleep. She is concerned with taking this with her history of tachycardia with prior ED visit -HR up to 188. She can take metoprolol as needed but was told not to take it with any other medications. There seems to be no structural heart abnormalities. Pt says she has bad long QT but last EKG shows OK. She may just need to see if she tolerates the med without any increase in her palpitations or high heart rates, but since she is concerned I will route this to Dr. Anne Fu for his input. He should have that input relayed to the patient as she will wait to start the med.

## 2019-02-19 NOTE — Telephone Encounter (Signed)
Pt aware OK to take trazadone.  She states she has not started it yet.

## 2019-02-19 NOTE — Telephone Encounter (Signed)
Ok to start Trazadone. OK to take with metoprolol if needed.  Donato Schultz, MD

## 2019-05-30 ENCOUNTER — Emergency Department (HOSPITAL_COMMUNITY)
Admission: EM | Admit: 2019-05-30 | Discharge: 2019-05-30 | Disposition: A | Discharge status: Home / Self Care | Payer: 59 | Attending: Emergency Medicine | Admitting: Emergency Medicine

## 2019-05-30 ENCOUNTER — Encounter (HOSPITAL_COMMUNITY): Payer: Self-pay

## 2019-05-30 ENCOUNTER — Other Ambulatory Visit: Payer: Self-pay

## 2019-05-30 ENCOUNTER — Emergency Department (HOSPITAL_COMMUNITY): Payer: 59

## 2019-05-30 DIAGNOSIS — Z5321 Procedure and treatment not carried out due to patient leaving prior to being seen by health care provider: Secondary | ICD-10-CM | POA: Insufficient documentation

## 2019-05-30 DIAGNOSIS — R0789 Other chest pain: Secondary | ICD-10-CM | POA: Diagnosis present

## 2019-05-30 MED ORDER — SODIUM CHLORIDE 0.9% FLUSH: 3.0000 mL | Freq: Once | INTRAVENOUS | Status: DC

## 2019-05-30 NOTE — ED Triage Notes (Signed)
Patient states 3 days ago she had intense pain in her left breast that radiated into her chest and has continued into today Patient called her PCP and was told to come to the ED due to comprehensive medical problems.

## 2019-05-30 NOTE — ED Notes (Signed)
Pt states that she has to leave because her dog is sick. Gave her stickers to registration.

## 2019-07-16 ENCOUNTER — Other Ambulatory Visit: Payer: Self-pay

## 2019-07-16 ENCOUNTER — Emergency Department (HOSPITAL_COMMUNITY): Payer: 59

## 2019-07-16 ENCOUNTER — Emergency Department (HOSPITAL_COMMUNITY)
Admission: EM | Admit: 2019-07-16 | Discharge: 2019-07-16 | Disposition: A | Discharge status: Home / Self Care | Payer: 59 | Attending: Emergency Medicine | Admitting: Emergency Medicine

## 2019-07-16 ENCOUNTER — Encounter (HOSPITAL_COMMUNITY): Payer: Self-pay | Admitting: Family Medicine

## 2019-07-16 DIAGNOSIS — R000 Tachycardia, unspecified: Secondary | ICD-10-CM | POA: Insufficient documentation

## 2019-07-16 DIAGNOSIS — Z8709 Personal history of other diseases of the respiratory system: Secondary | ICD-10-CM | POA: Diagnosis not present

## 2019-07-16 DIAGNOSIS — Z9889 Other specified postprocedural states: Secondary | ICD-10-CM | POA: Insufficient documentation

## 2019-07-16 DIAGNOSIS — N938 Other specified abnormal uterine and vaginal bleeding: Secondary | ICD-10-CM | POA: Diagnosis not present

## 2019-07-16 DIAGNOSIS — N939 Abnormal uterine and vaginal bleeding, unspecified: Secondary | ICD-10-CM

## 2019-07-16 DIAGNOSIS — F17200 Nicotine dependence, unspecified, uncomplicated: Secondary | ICD-10-CM | POA: Diagnosis not present

## 2019-07-16 DIAGNOSIS — F319 Bipolar disorder, unspecified: Secondary | ICD-10-CM | POA: Insufficient documentation

## 2019-07-16 DIAGNOSIS — Z79899 Other long term (current) drug therapy: Secondary | ICD-10-CM | POA: Diagnosis not present

## 2019-07-16 LAB — BASIC METABOLIC PANEL
Anion gap: 11 (ref 5–15)
BUN: 8 mg/dL (ref 6–20)
CO2: 22 mmol/L (ref 22–32)
Calcium: 8.9 mg/dL (ref 8.9–10.3)
Chloride: 107 mmol/L (ref 98–111)
Creatinine, Ser: 0.93 mg/dL (ref 0.44–1.00)
GFR calc Af Amer: >60 mL/min (ref >60)
GFR calc non Af Amer: >60 mL/min (ref >60)
Glucose, Bld: 131 mg/dL — ABNORMAL HIGH (ref 70–99)
Potassium: 3.8 mmol/L (ref 3.5–5.1)
Sodium: 140 mmol/L (ref 135–145)

## 2019-07-16 LAB — CBC
HCT: 39.3 % (ref 36.0–46.0)
Hemoglobin: 13.1 g/dL (ref 12.0–15.0)
MCH: 30.3 pg (ref 26.0–34.0)
MCHC: 33.3 g/dL (ref 30.0–36.0)
MCV: 91 fL (ref 80.0–100.0)
Platelets: 270 10*3/uL (ref 150–400)
RBC: 4.32 MIL/uL (ref 3.87–5.11)
RDW: 12 % (ref 11.5–15.5)
WBC: 6.9 10*3/uL (ref 4.0–10.5)
nRBC: 0 % (ref 0.0–0.2)

## 2019-07-16 LAB — TROPONIN I (HIGH SENSITIVITY): Troponin I (High Sensitivity): <2 ng/L (ref <18)

## 2019-07-16 LAB — I-STAT BETA HCG BLOOD, ED (NOT ORDERABLE): I-stat hCG, quantitative: <5 m[IU]/mL (ref <5)

## 2019-07-16 MED ORDER — SODIUM CHLORIDE 0.9% FLUSH: 3.0000 mL | Freq: Once | INTRAVENOUS | Status: DC

## 2019-07-16 NOTE — ED Provider Notes (Signed)
[facility] Provider Note   CSN: [id number] Arrival date & time: 07/16/19  0008     History   Chief Complaint Chief Complaint  Patient presents with  . Vaginal Bleeding  . Chest Pain    HPI Anne Hubbard is a 28 y.o. female with history of anxiety, asthma, bipolar 2 disorder, GERD, fibromyalgia, migraines, PTSD, ETD presents for evaluation of acute onset, progressively worsening vaginal bleeding since yesterday.  She underwent colposcopy 2 days ago with her OB/GYN through [facility].  She states that she tolerated the procedure well and did have some mild vaginal bleeding/spotting.  She states that yesterday evening at around 9 PM she was on the commode attempting to have a bowel movement when she passed a large blood clot through her vagina and since then has had some worsening vaginal bleeding.  Also reports worsening lower abdominal cramping but denies any vomiting or fevers.  She states that she called her OB/GYN who instructed her to go to [facility] near where she had her procedure done and states that while being triaged in their emergency department she was noted to be tachycardic to the 160s and was feeling lightheaded and nauseated.  She reports that this has since resolved.  She states that it is not unusual for her to become tachycardic up to the 120s or 130s especially with sitting upright or ambulating but it is a little unusual for her to be tachycardic to the 160s.  No syncope or chest pain.  She reports she left [facility] ED due to long wait times and decided to come here for further evaluation.  She has not tried anything for her symptoms.  Not on any blood thinners.     The history is provided by the patient.    Past Medical History:  Diagnosis Date  . Anxiety   . Arthritis   . Asthma   . Bipolar 2 disorder (HCC)   . Chicken pox   . Chronic bronchitis (HCC)   . Depression   . Eating disorder   .  Fainting spell   . Fibromyalgia   . Frequent headaches   . GERD (gastroesophageal reflux disease)   . Hypotension   . Insomnia   . Migraine   . Migraines   . PTSD (post-traumatic stress disorder)   . Urinary tract infection   . Urine incontinence     Patient Active Problem List   Diagnosis Date Noted  . Fibromyalgia 09/27/2018  . Anxiety 09/27/2018  . Muscle spasms of neck 09/27/2018  . Insomnia 09/27/2018  . ETD (Eustachian tube dysfunction), left 09/17/2018  . Migraine without status migrainosus, not intractable 09/17/2018  . Bipolar affective disorder, current episode depressed (HCC) 09/17/2018  . 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 PTSD (post-traumatic stress disorder) 09/17/2018  . Healthcare maintenance 09/17/2018    Past Surgical History:  Procedure Laterality Date  . SEPTOPLASTY    . TUBAL LIGATION       OB History    Gravida      Para      Term      Preterm      AB      Living  0     SAB      TAB      Ectopic      Multiple      Live Births               Home Medications  Prior to Admission medications   Medication Sig Start Date End Date Taking? Authorizing Provider  Alum Hydroxide-Mag Carbonate (GAVISCON EXTRA STRENGTH PO) Take 10 mLs by mouth every 8 (eight) hours as needed (heart burn).   Yes [provider]  aspirin-acetaminophen-caffeine (EXCEDRIN MIGRAINE) [phone] MG tablet Take 1 tablet by mouth every 6 (six) hours as needed for headache.   Yes [provider]  Camphor-Menthol-Methyl Sal (SALONPAS) 3.10-14-08 % PTCH Place 1 patch onto the skin 2 (two) times daily as needed (pain).   Yes [provider]  clonazePAM (KLONOPIN) 1 MG tablet Take 1 mg by mouth 2 (two) times daily as needed for anxiety.   Yes [provider]  cyclobenzaprine (FLEXERIL) 5 MG tablet Take 5 mg by mouth 3 (three) times daily as needed for muscle spasms.   Yes [provider]  fluticasone (FLONASE) 50  MCG/ACT nasal spray Place 1 spray into both nostrils daily as needed for allergies or rhinitis.   Yes [provider]  fluticasone furoate-vilanterol (BREO ELLIPTA) 100-25 MCG/INH AEPB Inhale 1 puff into the lungs daily as needed (wheezing).    Yes [provider]  ibuprofen (ADVIL,MOTRIN) 200 MG tablet Take 400-800 mg by mouth every 6 (six) hours as needed for mild pain or moderate pain.    Yes [provider]  levalbuterol (XOPENEX HFA) 45 MCG/ACT inhaler Inhale 2 puffs into the lungs every 8 (eight) hours as needed for wheezing or shortness of breath.  02/07/19  Yes [provider]  Levonorgest-Eth Est & Eth Est Reinaldo Berber) 42-21-21-7 DAYS TABS Take 1 tablet by mouth daily.   Yes [provider]  meclizine (ANTIVERT) 12.5 MG tablet Take 12.5 mg by mouth 3 (three) times daily as needed for dizziness.   Yes [provider]  metoprolol tartrate (LOPRESSOR) 25 MG tablet Take 1 tablet (25 mg total) by mouth every 6 (six) hours as needed. Patient taking differently: Take 25 mg by mouth every 6 (six) hours as needed. For high heart rate 11/19/18  Yes Jerline Pain, MD  Multiple Vitamins-Minerals (ONE-A-DAY WOMENS PO) Take 1 tablet by mouth daily.   Yes [provider]  naphazoline-glycerin (CLEAR EYES REDNESS) 0.012-0.2 % SOLN Place 1-2 drops into both eyes 4 (four) times daily as needed for eye irritation.   Yes [provider]  ondansetron (ZOFRAN) 4 MG tablet Take 4 mg by mouth every 8 (eight) hours as needed for nausea or vomiting.   Yes [provider]  pantoprazole (PROTONIX) 40 MG tablet Take 40 mg by mouth every other day.    Yes [provider]  Probiotic Product (PROBIOTIC DAILY PO) Take 1 tablet by mouth 2 (two) times daily.   Yes [provider]    Family History Family History  Problem Relation Age of Onset  . Arthritis Mother   . Asthma Mother   . Depression Mother   . Diabetes Mother   .  Drug abuse Mother   . Miscarriages / [place] Mother   . Stroke Mother   . Learning disabilities Mother   . Arthritis Father   . Hearing loss Father   . Hyperlipidemia Father   . Hypertension Father   . Alcohol abuse Sister   . Asthma Sister   . Depression Sister   . Mental illness Sister   . Arthritis Maternal Grandmother   . Cancer Maternal Grandmother   . Stroke Maternal Grandmother   . Cancer Maternal Grandfather   . COPD Maternal Grandfather   .  Hearing loss Maternal Grandfather   . Depression Sister   . Hearing loss Sister   . Intellectual disability Sister   . Mental illness Sister   . Learning disabilities Sister     Social History Social History   Tobacco Use  . Smoking status: Current Every Day Smoker    Types: E-cigarettes    Last attempt to quit: 08/31/2016    Years since quitting: 2.8  . Smokeless tobacco: Never Used  Substance Use Topics  . Alcohol use: Not Currently  . Drug use: Not Currently     Allergies   Amoxicillin, Benadryl [diphenhydramine], Penicillins, Buspirone, Flax seed [bio-flax], and Kiwi extract   Review of Systems Review of Systems  Constitutional: Negative for chills and fever.  Respiratory: Positive for shortness of breath.   Cardiovascular: Positive for palpitations. Negative for chest pain.  Gastrointestinal: Positive for nausea. Negative for abdominal pain and vomiting.  Genitourinary: Positive for pelvic pain and vaginal bleeding. Negative for vaginal discharge and vaginal pain.  Neurological: Positive for light-headedness. Negative for syncope.  All other systems reviewed and are negative.    Physical Exam Updated Vital Signs BP 116/79 (BP Location: Left Arm)   Pulse 77   Temp 98.6 F (37 C) (Oral)   Resp 13   Ht 5\' 3"  (1.6 m)   Wt 60.3 kg   SpO2 99%   BMI 23.56 kg/m   Physical Exam Vitals signs and nursing note reviewed. Exam conducted with a chaperone present.  Constitutional:      General: She is not  in acute distress.    Appearance: She is well-developed.  HENT:     Head: Normocephalic and atraumatic.  Eyes:     General:        Right eye: No discharge.        Left eye: No discharge.     Conjunctiva/sclera: Conjunctivae normal.  Neck:     Musculoskeletal: Normal range of motion and neck supple.     Vascular: No JVD.     Trachea: No tracheal deviation.  Cardiovascular:     Rate and Rhythm: Normal rate and regular rhythm.  Pulmonary:     Effort: Pulmonary effort is normal.  Abdominal:     General: Bowel sounds are normal. There is no distension.     Palpations: Abdomen is soft.     Tenderness: There is no abdominal tenderness. There is no guarding or rebound.  Genitourinary:    Vagina: Bleeding present.     Comments: Examination performed in the presence of a chaperone.  Moderate amount of rust colored blood in the vaginal vault.  No active bleeding noted. Musculoskeletal:     Right lower leg: She exhibits no tenderness. No edema.     Left lower leg: She exhibits no tenderness. No edema.  Skin:    General: Skin is warm and dry.     Findings: No erythema.  Neurological:     Mental Status: She is alert.  Psychiatric:        Behavior: Behavior normal.      ED Treatments / Results  Labs (all labs ordered are listed, but only abnormal results are displayed) Labs Reviewed  BASIC METABOLIC PANEL - Abnormal; Notable for the following components:      Result Value   Glucose, Bld 131 (*)    All other components within normal limits  CBC  I-STAT BETA HCG BLOOD, ED ([facility], [facility], [facility] ONLY)  I-STAT BETA HCG BLOOD, ED (NOT ORDERABLE)  TROPONIN  I (HIGH SENSITIVITY)    EKG ED ECG REPORT   Date: 07/16/2019  Rate: 96  Rhythm: normal sinus rhythm  QRS Axis: normal  Intervals: normal  ST/T Wave abnormalities: nonspecific T wave changes  Conduction Disutrbances:none  Narrative Interpretation:   Old EKG Reviewed: unchanged; QT interval shorter today  I have personally reviewed  the EKG tracing and agree with the computerized printout as noted.   Radiology  Dg Chest 2 View  Result Date: 07/16/2019 CLINICAL DATA:  Chest pain EXAM: CHEST - 2 VIEW COMPARISON:  05/30/2019 FINDINGS: The heart size and mediastinal contours are within normal limits. Both lungs are clear. The visualized skeletal structures are unremarkable. IMPRESSION: No active cardiopulmonary disease. Electronically Signed   By: Jasmine Pang M.D.   On: 07/16/2019 01:59    Procedures Procedures (including critical care time)  Medications Ordered in ED Medications - No data to display   Initial Impression / Assessment and Plan / ED Course  I have reviewed the triage vital signs and the nursing notes.  Pertinent labs & imaging results that were available during my care of the patient were reviewed by me and considered in my medical decision making (see chart for details).        Patient presenting today for evaluation of vaginal bleeding 2 days status post colposcopy.  Also had an episode of palpitations and lightheadedness when she was being triaged at an outside hospital but this has since resolved.  EKG today shows no acute ischemic abnormalities, QTc interval is shorter today than previously.  A troponin was obtained in triage which was negative and her symptoms do not sound cardiac in etiology.  I do not think that she requires serial troponins for this reason.  Doubt PE, cardiac tamponade, esophageal rupture, pneumothorax, or pneumonia.  Lab work reviewed by me shows no leukocytosis, no anemia, no metabolic derangements, no renal insufficiency.  Pregnancy test is negative.  Physical examination is reassuring with scant amount of rust colored discharge in the vaginal vault consistent with some vaginal bleeding but she does not appear to have any active hemorrhage or significant bleeding.  She is hemodynamically stable and H&H is also reassuring.  She has no significant pelvic pain on examination and  examination of the abdomen is benign.  Doubt acute surgical abdominal pathology uterine infection, sepsis, PID, ovarian torsion, or ectopic pregnancy.  Suspect this amount of bleeding after her colposcopy is actually normal.  She has Naprosyn at home which I encouraged her to take along with Tylenol for pain control.  She will call her OB/GYN in the morning for further recommendations on follow-up.  Discussed strict ED return precautions. Pt verbalized understanding of and agreement with plan and is safe for discharge home at this time.  No complaints prior to discharge.  Final Clinical Impressions(s) / ED Diagnoses   Final diagnoses:  Status post colposcopy  Vaginal bleeding  Tachycardia    ED Discharge Orders    None       Jeanie Sewer, PA-C 07/18/19 1500    Nira Conn, MD 07/19/19 [phone]

## 2019-07-16 NOTE — ED Notes (Signed)
Pt verbalized discharge instructions and follow up care. Alert and ambulatory. No IV. Partner is driving pt home.

## 2019-07-16 NOTE — Discharge Instructions (Signed)
Take naproxen twice daily with meals as needed for pain.  You can take 1 to 2 tablets of Tylenol every 6 hours as needed for pain.  Drink plenty of water and get plenty of rest.  Apply heating pad to the lower abdomen 20 minutes at a time 3 or 4 times daily as needed for pelvic cramping.  Call your OB/GYN in the morning for further recommendations and to set up follow-up appointment.  Return to the emergency department immediately for any concerning signs or symptoms develop such as high fevers, persistent vomiting, worsening pain, or loss of consciousness.

## 2019-07-16 NOTE — ED Triage Notes (Signed)
Patient had a colposcopy two days ago with [facility]. Yesterday afternoon, she developed vaginal bleeding and chest pain. Also, feels weak and like she is about to pass out. Chest pain is centrally and left sided. Described as sharp. Associated symptoms of nausea, shortness of breath, dizziness, and lightheadedness.

## 2019-09-13 IMAGING — CR DG CHEST 2V
2 series · 2 of 2 positions shown · non-contrast
Comparison: None.

CLINICAL DATA: Acute onset LEFT-sided chest pain radiating into the
LEFT arm associated with shortness of breath.

EXAM:
CHEST - 2 VIEW

[w chest pa]
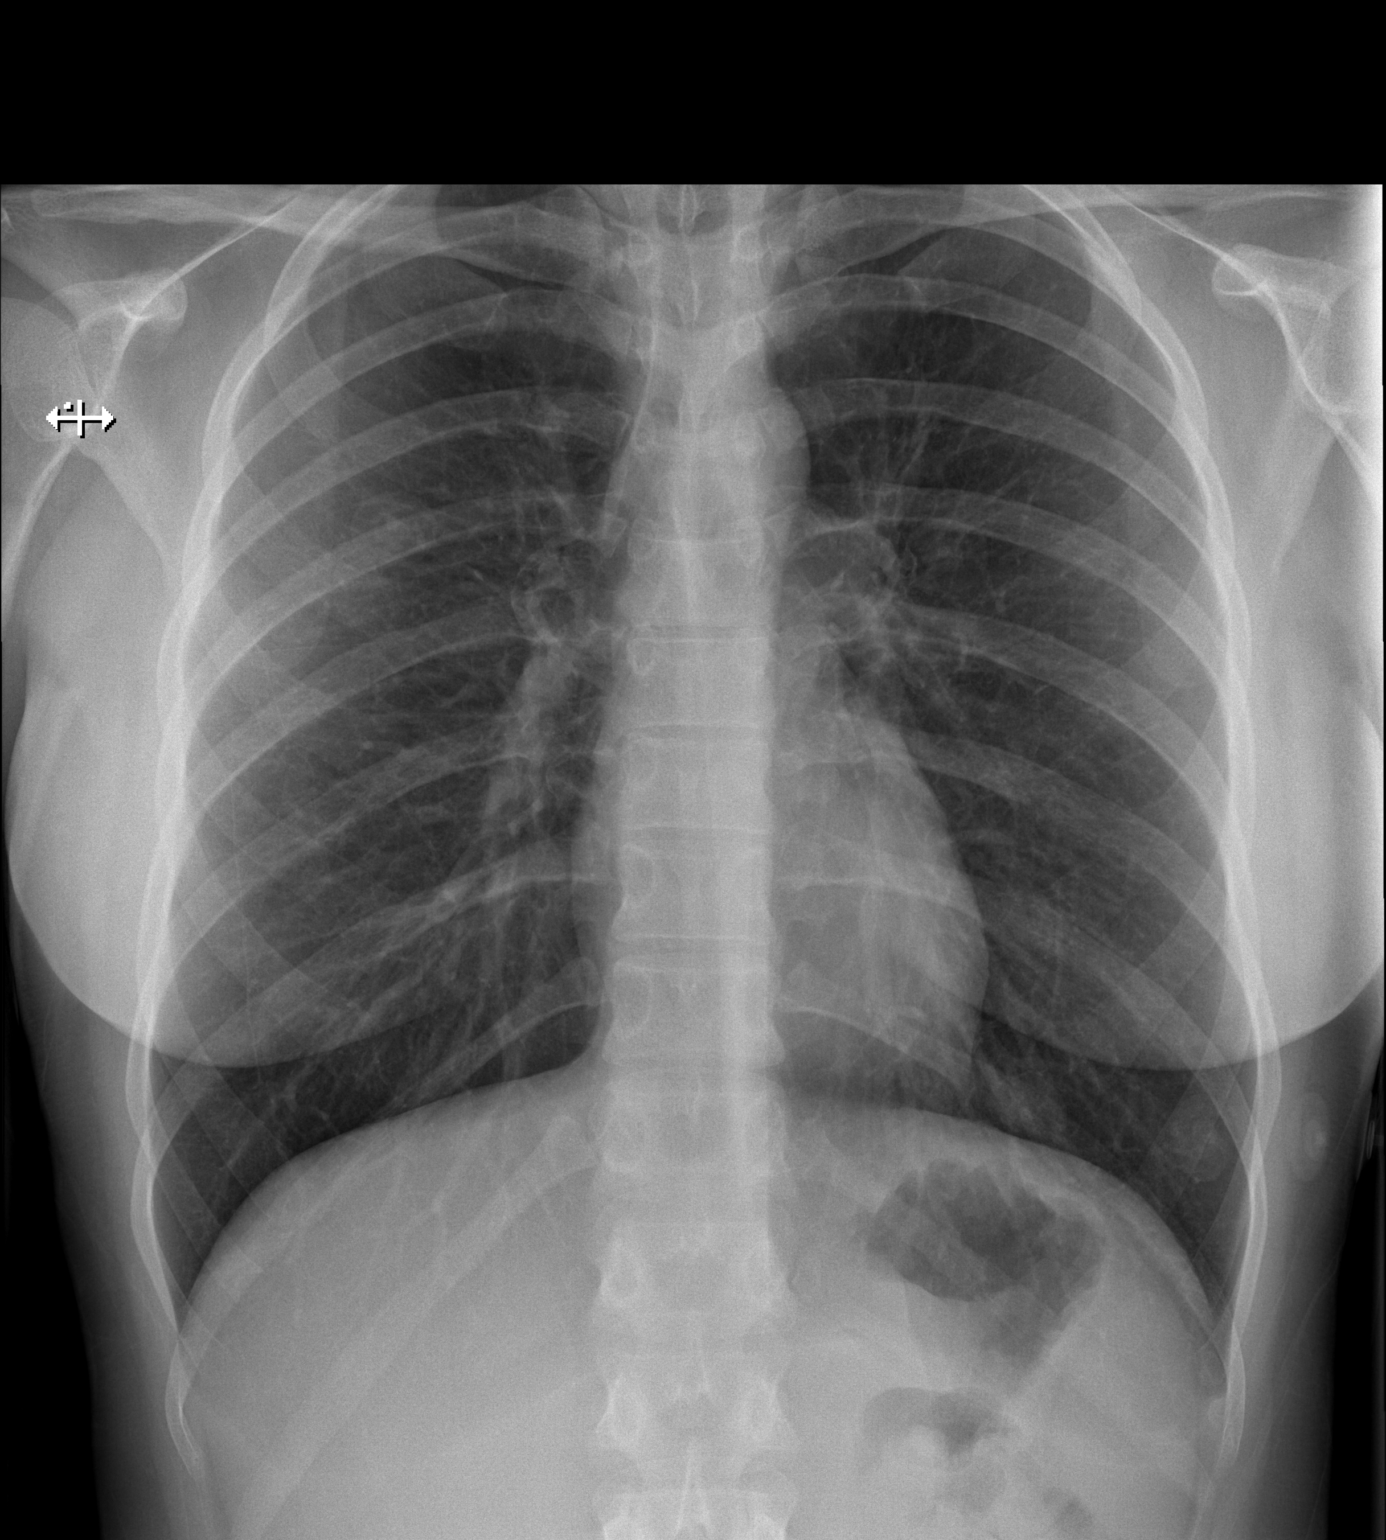

[w chest lat]
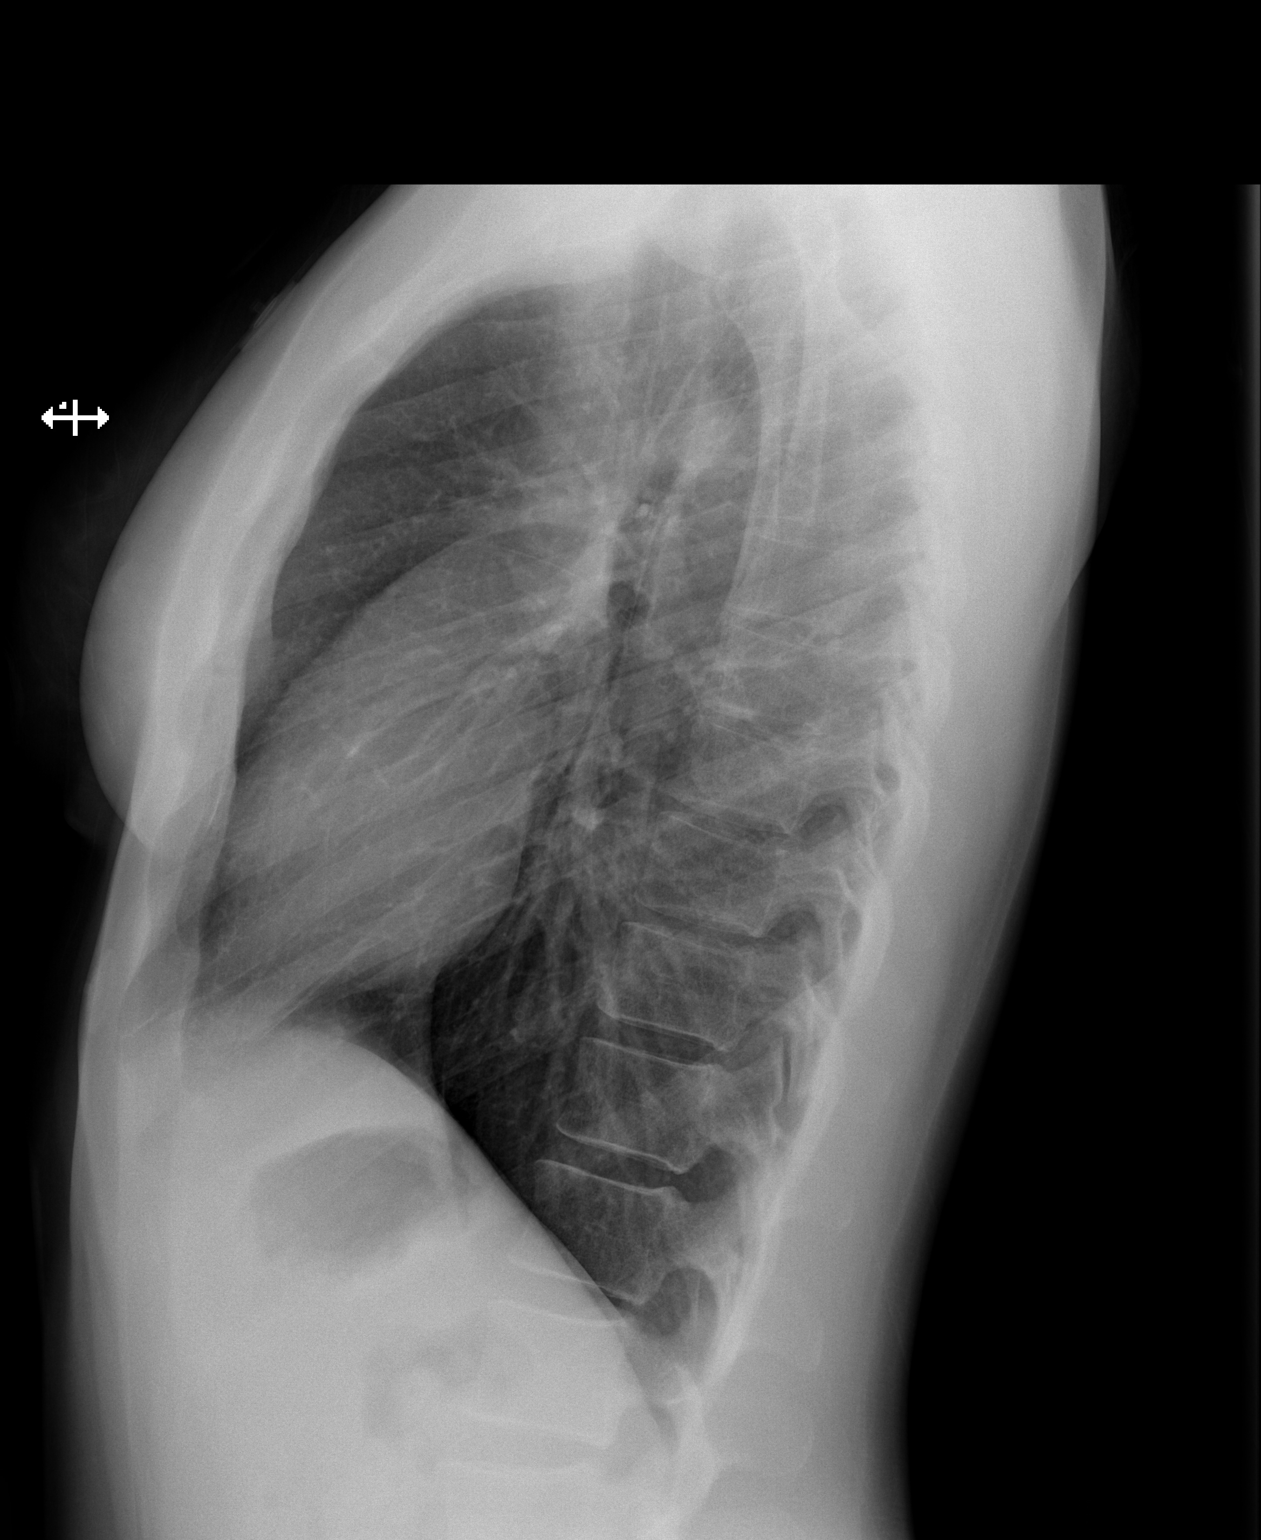

[2 of 2 positions shown; findings below may reference images not displayed]

FINDINGS: Cardiomediastinal silhouette unremarkable. Lungs clear.
Bronchovascular markings normal. Pulmonary vascularity normal. No
pneumothorax. No pleural effusions. Visualized bony thorax intact.
IMPRESSION: Normal examination.

## 2020-07-28 IMAGING — CR DG CHEST 2V
2 series · 2 of 2 positions shown · non-contrast
Comparison: 05/30/2019

CLINICAL DATA: Chest pain

EXAM:
CHEST - 2 VIEW

[w chest pa]
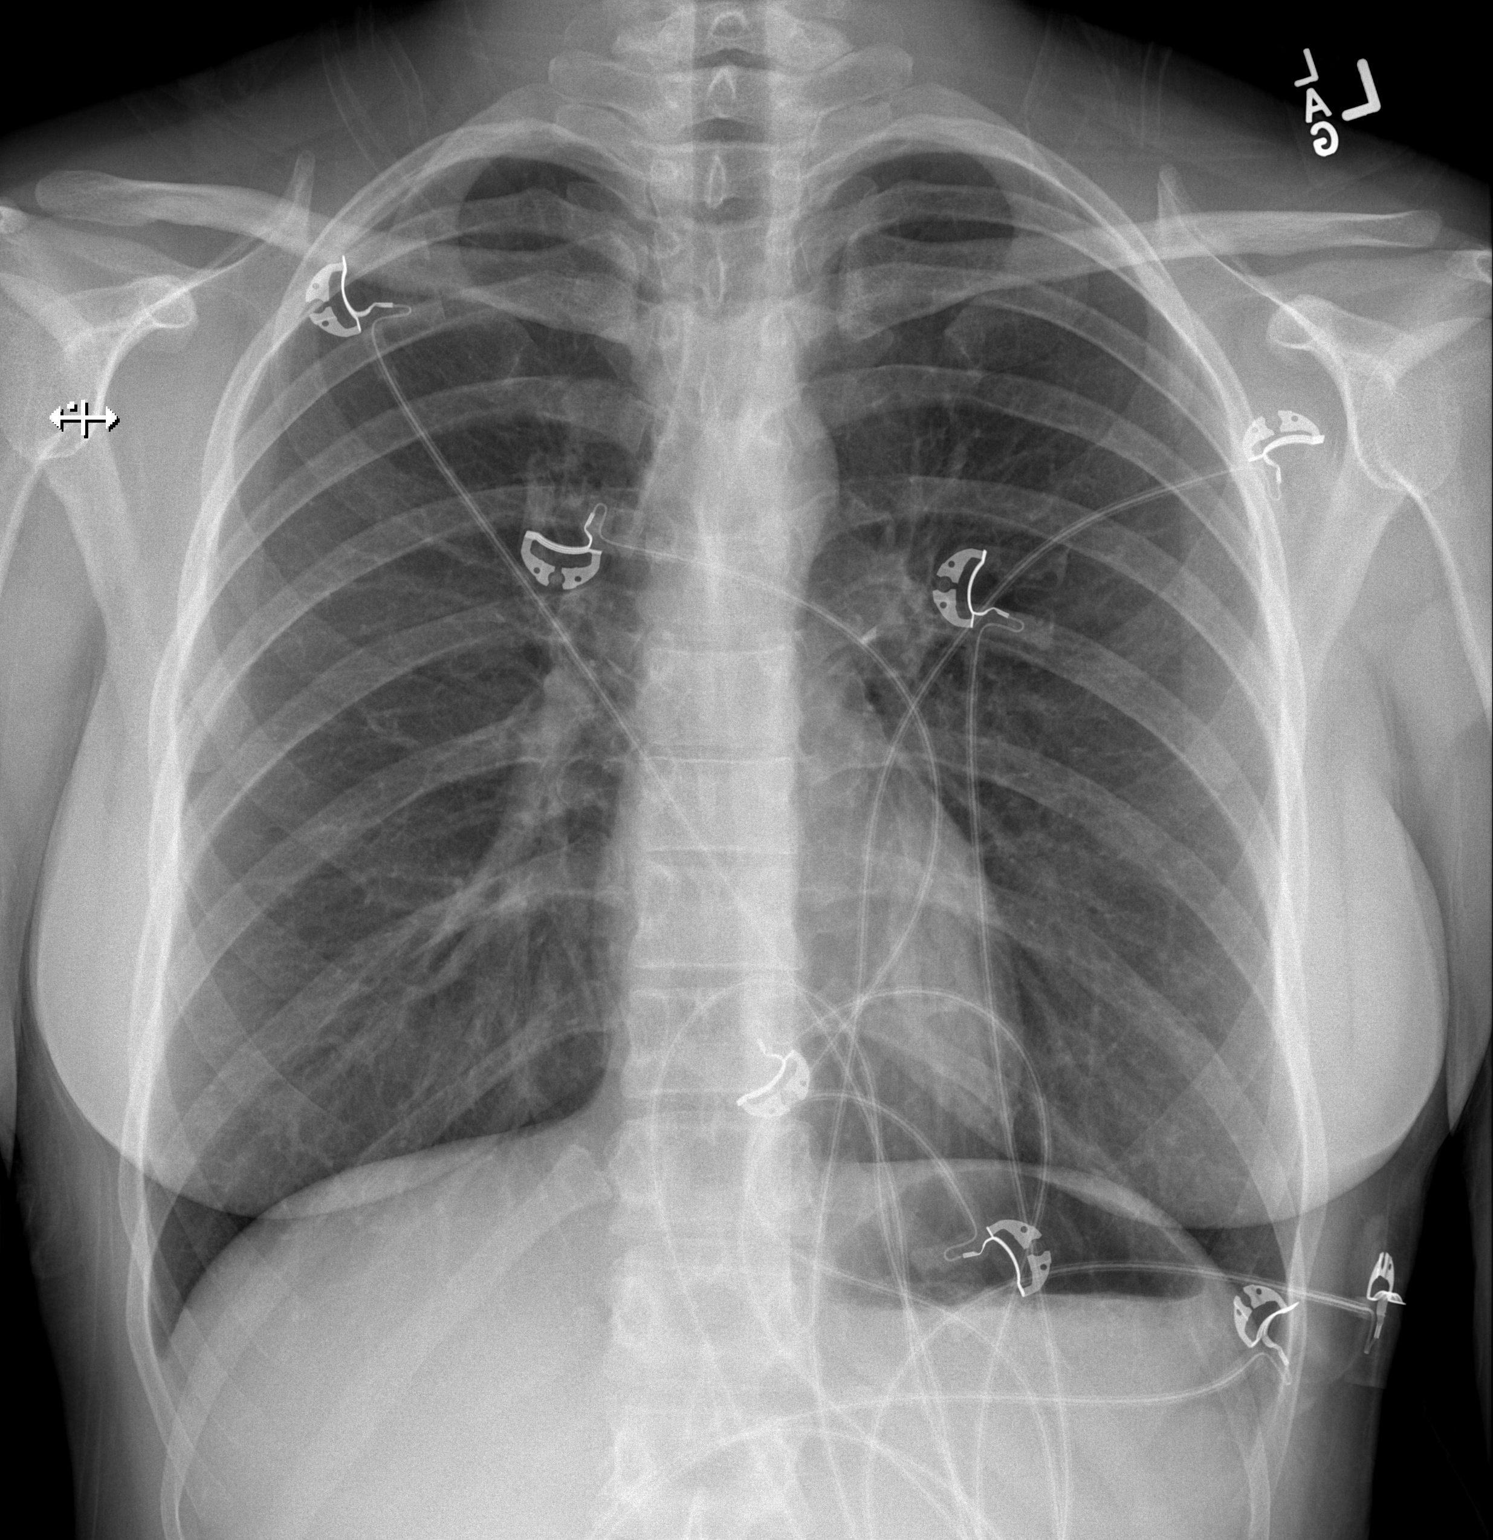

[w chest lat]
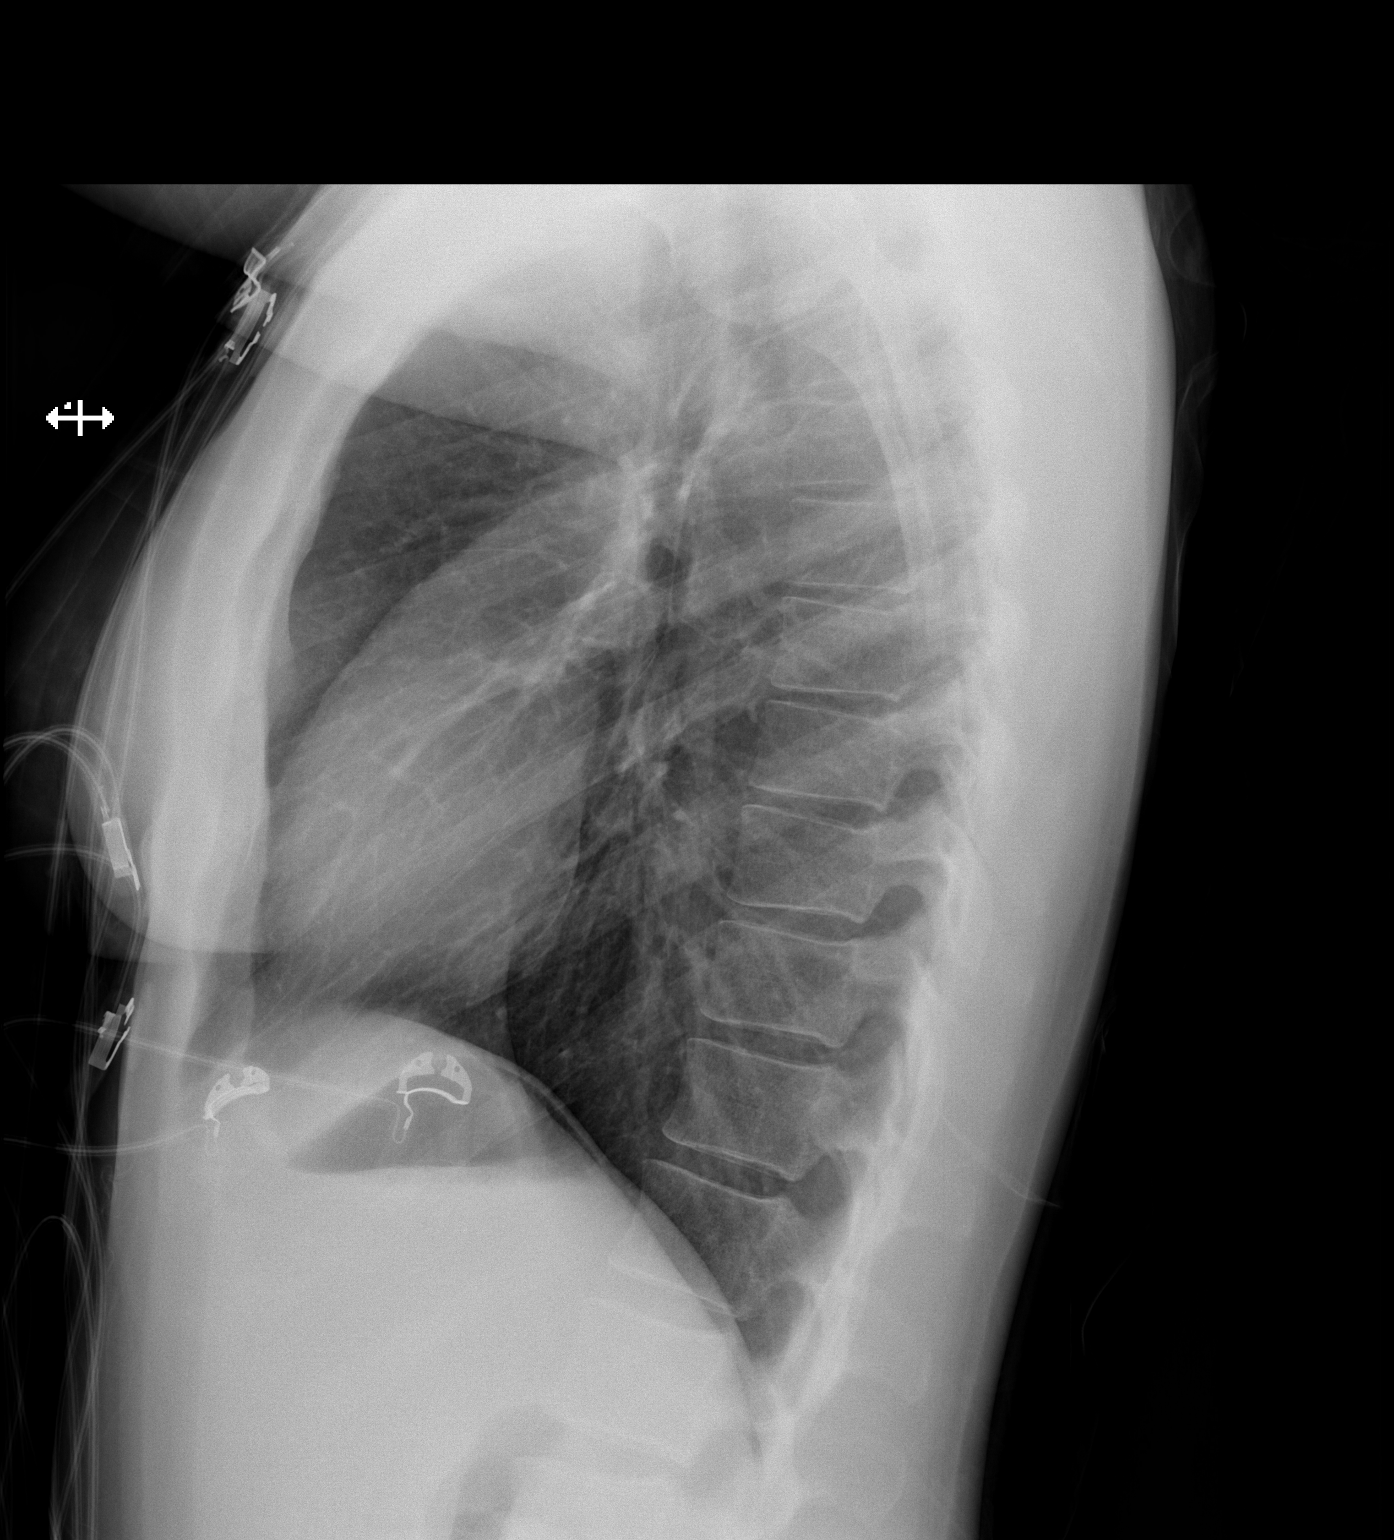

[2 of 2 positions shown; findings below may reference images not displayed]

FINDINGS: The heart size and mediastinal contours are within normal limits.
Both lungs are clear. The visualized skeletal structures are
unremarkable.
IMPRESSION: No active cardiopulmonary disease.
